# Patient Record
Sex: Female | Born: 1990 | Hispanic: No | Smoking: Never smoker
Health system: Southern US, Community
[De-identification: ages and names within clinical notes are randomized; demographics above are authoritative.]

## PROBLEM LIST (undated history)

## (undated) DIAGNOSIS — F32A Depression, unspecified: Secondary | ICD-10-CM

## (undated) DIAGNOSIS — B019 Varicella without complication: Secondary | ICD-10-CM

## (undated) DIAGNOSIS — I10 Essential (primary) hypertension: Secondary | ICD-10-CM

## (undated) DIAGNOSIS — F329 Major depressive disorder, single episode, unspecified: Secondary | ICD-10-CM

## (undated) DIAGNOSIS — Z9889 Other specified postprocedural states: Secondary | ICD-10-CM

## (undated) HISTORY — PX: BIOPSY THYROID: PRO38

## (undated) HISTORY — DX: Essential (primary) hypertension: I10

## (undated) HISTORY — DX: Other specified postprocedural states: Z98.890

## (undated) HISTORY — DX: Depression, unspecified: F32.A

## (undated) HISTORY — PX: OTHER SURGICAL HISTORY: SHX169

## (undated) HISTORY — DX: Major depressive disorder, single episode, unspecified: F32.9

## (undated) HISTORY — DX: Varicella without complication: B01.9

---

## 2015-08-27 ENCOUNTER — Encounter: Payer: Self-pay | Admitting: Adult Health

## 2015-08-27 ENCOUNTER — Telehealth: Payer: Self-pay | Admitting: Adult Health

## 2015-08-27 ENCOUNTER — Ambulatory Visit (INDEPENDENT_AMBULATORY_CARE_PROVIDER_SITE_OTHER): Payer: 59 | Admitting: Adult Health

## 2015-08-27 VITALS — BP 104/80 | Temp 98.3°F | Ht 64.5 in | Wt 231.9 lb

## 2015-08-27 DIAGNOSIS — F329 Major depressive disorder, single episode, unspecified: Secondary | ICD-10-CM

## 2015-08-27 DIAGNOSIS — F32A Depression, unspecified: Secondary | ICD-10-CM | POA: Insufficient documentation

## 2015-08-27 DIAGNOSIS — Z7189 Other specified counseling: Secondary | ICD-10-CM | POA: Diagnosis not present

## 2015-08-27 DIAGNOSIS — Z7689 Persons encountering health services in other specified circumstances: Secondary | ICD-10-CM

## 2015-08-27 MED ORDER — BUPROPION HCL ER (XL) 150 MG PO TB24: 150.0000 mg | ORAL_TABLET | Freq: Every day | ORAL | Status: DC

## 2015-08-27 NOTE — Telephone Encounter (Signed)
Pt is at pharm and they do not have her buPROPion (WELLBUTRIN XL) 150 MG 24 hr tablet She is gonna wait a few minutes, thanks! [facility]

## 2015-08-27 NOTE — Progress Notes (Signed)
Pre visit review using our clinic review tool, if applicable. No additional management support is needed unless otherwise documented below in the visit note. 

## 2015-08-27 NOTE — Telephone Encounter (Signed)
Called back to the pharmacy and was told they found the medication and was filling it for the patient.

## 2015-08-27 NOTE — Progress Notes (Signed)
HPI:  Jasmine Coleman is here to establish care. She is a pleasant AA female who  has a past medical history of Chicken pox; Depression; and Hypertension.  Last PCP and physical: Unknown " a long time"   Has the following chronic problems that require follow up and concerns today:  Depression  - PHQ 9 score 16. She has tried Buspar, Zoloft and Celexa in the past. Feels as though Buspar worked ok, the others did not. She has called psychiatry in the past but the wait was three months. She took an Orthoptist which said that " she was at risk for bipolar"  ADHD - Was diagnosed at a young age, was on meds from 2nd grade to middle school. She feels as though her ADHD symptoms are coming back. She has racing thoughts and is unable to focus on one thing.    ROS negative for unless reported above: fevers, chills,feeling poorly, unintentional weight loss, hearing or vision loss, chest pain, palpitations, leg claudication, struggling to breath,Not feeling congested in the chest, no orthopenia, no cough,no wheezing, normal appetite, no soft tissue swelling, no hemoptysis, melena, hematochezia, hematuria, falls, loc, si, or thoughts of self harm.  Immunizations:UTD Diet: Does not follow specific diet. 2-3 times a week eats fast food or eats out Exercise: Walks at work, nothing outside of work Pap Smear: June 2016, no abnormals  Past Medical History  Diagnosis Date  . Chicken pox   . Depression   . Hypertension     high blood pressure readings   . Thyroid disease     History reviewed. No pertinent past surgical history.  Family History  Problem Relation Age of Onset  . Lung cancer    . Prostate cancer    . Hypertension    . Mental illness      Social History   Social History  . Marital Status: Married    Spouse Name: N/A  . Number of Children: N/A  . Years of Education: N/A   Social History Main Topics  . Smoking status: Never Smoker   . Smokeless tobacco: None  . Alcohol  Use: 0.0 oz/week    0 Standard drinks or equivalent per week     Comment: occ.   . Drug Use: None  . Sexual Activity: Not Asked   Other Topics Concern  . None   Social History Narrative  . None    No current outpatient prescriptions on file.  EXAM:  Filed Vitals:   08/27/15 1358  BP: 104/80  Temp: 98.3 F (36.8 C)    Body mass index is 39.21 kg/(m^2).  GENERAL: vitals reviewed and listed above, alert, oriented, appears well hydrated and in no acute distress. Slightly obese  HEENT: atraumatic, conjunttiva clear, no obvious abnormalities on inspection of external nose and ears  NECK: Neck is soft and supple without masses, no adenopathy or thyromegaly, trachea midline, no JVD. Normal range of motion.   LUNGS: clear to auscultation bilaterally, no wheezes, rales or rhonchi, good air movement  CV: Regular rate and rhythm, normal S1/S2, no audible murmurs, gallops, or rubs. No carotid bruit and no peripheral edema.   MS: moves all extremities without noticeable abnormality. No edema noted  Abd: soft/nontender/nondistended/normal bowel sounds   Skin: warm and dry, no rash   Extremities: No clubbing, cyanosis, or edema. Capillary refill is WNL. Pulses intact bilaterally in upper and lower extremities.   Neuro: CN II-XII intact, sensation and reflexes normal throughout, 5/5 muscle strength in  bilateral upper and lower extremities. Normal finger to nose. Normal rapid alternating movements.   PSYCH: pleasant and cooperative, no obvious depression or anxiety  ASSESSMENT AND PLAN:  1. Encounter to establish care - Follow up for CPE - Follow up sooner - Stressed the importance of diet and exercise 2. Depression - buPROPion (WELLBUTRIN XL) 150 MG 24 hr tablet; Take 1 tablet (150 mg total) by mouth daily.  Dispense: 30 tablet; Refill: 3 - List of psychiatrists and ADHD clinics given - Go to the ER with any thoughts of SI or HI - PHQ 9 score of 16   -We reviewed the  PMH, PSH, FH, SH, Meds and Allergies. -We provided refills for any medications we will prescribe as needed. -We addressed current concerns per orders and patient instructions. -We have asked for records for pertinent exams, studies, vaccines and notes from previous providers. -We have advised patient to follow up per instructions below.   -Patient advised to return or notify a provider immediately if symptoms worsen or persist or new concerns arise.    Shirline Frees, AGNP

## 2015-08-27 NOTE — Patient Instructions (Signed)
It was great meeting you!  Follow up with one of the clinics for ADHD and Depression.   Start the Wellbutrin, you should notice effect in 4-6 weeks.   Follow up with me at my next available appointment for a complete physical

## 2015-10-07 ENCOUNTER — Other Ambulatory Visit (INDEPENDENT_AMBULATORY_CARE_PROVIDER_SITE_OTHER): Payer: 59

## 2015-10-07 DIAGNOSIS — Z0000 Encounter for general adult medical examination without abnormal findings: Secondary | ICD-10-CM

## 2015-10-07 LAB — CBC WITH DIFFERENTIAL/PLATELET
BASOS PCT: 0.6 % (ref 0.0–3.0)
Basophils Absolute: 0 10*3/uL (ref 0.0–0.1)
EOS PCT: 3.8 % (ref 0.0–5.0)
Eosinophils Absolute: 0.3 10*3/uL (ref 0.0–0.7)
HEMATOCRIT: 43.1 % (ref 36.0–46.0)
Hemoglobin: 14 g/dL (ref 12.0–15.0)
LYMPHS ABS: 2.9 10*3/uL (ref 0.7–4.0)
LYMPHS PCT: 39.7 % (ref 12.0–46.0)
MCHC: 32.5 g/dL (ref 30.0–36.0)
MCV: 87.2 fl (ref 78.0–100.0)
MONOS PCT: 6.8 % (ref 3.0–12.0)
Monocytes Absolute: 0.5 10*3/uL (ref 0.1–1.0)
NEUTROS ABS: 3.6 10*3/uL (ref 1.4–7.7)
Neutrophils Relative %: 49.1 % (ref 43.0–77.0)
Platelets: 266 10*3/uL (ref 150.0–400.0)
RBC: 4.94 Mil/uL (ref 3.87–5.11)
RDW: 13.2 % (ref 11.5–15.5)
WBC: 7.4 10*3/uL (ref 4.0–10.5)

## 2015-10-07 LAB — HEPATIC FUNCTION PANEL
ALT: 12 U/L (ref 0–35)
AST: 17 U/L (ref 0–37)
Albumin: 3.7 g/dL (ref 3.5–5.2)
Alkaline Phosphatase: 71 U/L (ref 39–117)
BILIRUBIN DIRECT: 0.1 mg/dL (ref 0.0–0.3)
TOTAL PROTEIN: 7.1 g/dL (ref 6.0–8.3)
Total Bilirubin: 0.4 mg/dL (ref 0.2–1.2)

## 2015-10-07 LAB — POCT URINALYSIS DIPSTICK
Bilirubin, UA: NEGATIVE
GLUCOSE UA: NEGATIVE
NITRITE UA: NEGATIVE
PH UA: 6
PROTEIN UA: NEGATIVE
RBC UA: NEGATIVE
UROBILINOGEN UA: 1

## 2015-10-07 LAB — BASIC METABOLIC PANEL
BUN: 13 mg/dL (ref 6–23)
CALCIUM: 9.1 mg/dL (ref 8.4–10.5)
CHLORIDE: 106 meq/L (ref 96–112)
CO2: 30 meq/L (ref 19–32)
Creatinine, Ser: 0.84 mg/dL (ref 0.40–1.20)
GFR: 88.87 mL/min (ref >60.00)
GLUCOSE: 92 mg/dL (ref 70–99)
POTASSIUM: 3.9 meq/L (ref 3.5–5.1)
SODIUM: 142 meq/L (ref 135–145)

## 2015-10-07 LAB — LIPID PANEL
CHOL/HDL RATIO: 3
Cholesterol: 155 mg/dL (ref 0–200)
HDL: 49.9 mg/dL (ref >39.00)
LDL CALC: 94 mg/dL (ref 0–99)
NONHDL: 104.85
TRIGLYCERIDES: 54 mg/dL (ref 0.0–149.0)
VLDL: 10.8 mg/dL (ref 0.0–40.0)

## 2015-10-07 LAB — TSH: TSH: 1.29 u[IU]/mL (ref 0.35–4.50)

## 2015-10-13 ENCOUNTER — Encounter: Payer: 59 | Admitting: Adult Health

## 2015-10-13 DIAGNOSIS — Z0289 Encounter for other administrative examinations: Secondary | ICD-10-CM

## 2015-10-25 ENCOUNTER — Encounter: Payer: Self-pay | Admitting: Adult Health

## 2016-01-28 DIAGNOSIS — Z30432 Encounter for removal of intrauterine contraceptive device: Secondary | ICD-10-CM | POA: Diagnosis not present

## 2016-01-28 DIAGNOSIS — Z309 Encounter for contraceptive management, unspecified: Secondary | ICD-10-CM | POA: Diagnosis not present

## 2016-02-14 MED FILL — NUVARING VAGINAL RING: 0.12-0.015 | 84 days supply | Qty: 3 | Fill #0

## 2016-04-03 DIAGNOSIS — F3181 Bipolar II disorder: Secondary | ICD-10-CM | POA: Diagnosis not present

## 2016-05-08 MED FILL — NUVARING VAGINAL RING: 0.12-0.015 | 55 days supply | Qty: 2 | Fill #1

## 2016-05-25 DIAGNOSIS — H5213 Myopia, bilateral: Secondary | ICD-10-CM | POA: Diagnosis not present

## 2016-06-15 ENCOUNTER — Ambulatory Visit (INDEPENDENT_AMBULATORY_CARE_PROVIDER_SITE_OTHER): Payer: 59 | Admitting: Adult Health

## 2016-06-15 ENCOUNTER — Encounter: Payer: Self-pay | Admitting: Adult Health

## 2016-06-15 VITALS — BP 124/64 | Temp 98.4°F | Ht 64.5 in | Wt 226.3 lb

## 2016-06-15 DIAGNOSIS — F32A Depression, unspecified: Secondary | ICD-10-CM

## 2016-06-15 DIAGNOSIS — F329 Major depressive disorder, single episode, unspecified: Secondary | ICD-10-CM

## 2016-06-15 DIAGNOSIS — E669 Obesity, unspecified: Secondary | ICD-10-CM | POA: Diagnosis not present

## 2016-06-15 MED ORDER — PHENTERMINE HCL 15 MG PO CAPS: 15.0000 mg | ORAL_CAPSULE | ORAL | Status: DC

## 2016-06-15 MED FILL — PHENTERMINE 15 MG CAPSULE: 15 | 30 days supply | Qty: 30 | Fill #0

## 2016-06-15 NOTE — Patient Instructions (Addendum)
It was great seeing you again!  As discussed, take the phentermine in the morning. Eat healthy and exercise, make sure your husband is working out with you.   Follow up with me in one month   If you need anything in the meantime, please let me know  Exercising to Lose Weight Exercising can help you to lose weight. In order to lose weight through exercise, you need to do vigorous-intensity exercise. You can tell that you are exercising with vigorous intensity if you are breathing very hard and fast and cannot hold a conversation while exercising. Moderate-intensity exercise helps to maintain your current weight. You can tell that you are exercising at a moderate level if you have a higher heart rate and faster breathing, but you are still able to hold a conversation. HOW OFTEN SHOULD I EXERCISE? Choose an activity that you enjoy and set realistic goals. Your health care provider can help you to make an activity plan that works for you. Exercise regularly as directed by your health care provider. This may include:  Doing resistance training twice each week, such as:  Push-ups.  Sit-ups.  Lifting weights.  Using resistance bands.  Doing a given intensity of exercise for a given amount of time. Choose from these options:  150 minutes of moderate-intensity exercise every week.  75 minutes of vigorous-intensity exercise every week.  A mix of moderate-intensity and vigorous-intensity exercise every week. Children, pregnant women, people who are out of shape, people who are overweight, and older adults may need to consult a health care provider for individual recommendations. If you have any sort of medical condition, be sure to consult your health care provider before starting a new exercise program. WHAT ARE SOME ACTIVITIES THAT CAN HELP ME TO LOSE WEIGHT?   Walking at a rate of at least 4.5 miles an hour.  Jogging or running at a rate of 5 miles per hour.  Biking at a rate of at  least 10 miles per hour.  Lap swimming.  Roller-skating or in-line skating.  Cross-country skiing.  Vigorous competitive sports, such as football, basketball, and soccer.  Jumping rope.  Aerobic dancing. HOW CAN I BE MORE ACTIVE IN MY DAY-TO-DAY ACTIVITIES?  Use the stairs instead of the elevator.  Take a walk during your lunch break.  If you drive, park your car farther away from work or school.  If you take public transportation, get off one stop early and walk the rest of the way.  Make all of your phone calls while standing up and walking around.  Get up, stretch, and walk around every 30 minutes throughout the day. WHAT GUIDELINES SHOULD I FOLLOW WHILE EXERCISING?  Do not exercise so much that you hurt yourself, feel dizzy, or get very short of breath.  Consult your health care provider prior to starting a new exercise program.  Wear comfortable clothes and shoes with good support.  Drink plenty of water while you exercise to prevent dehydration or heat stroke. Body water is lost during exercise and must be replaced.  Work out until you breathe faster and your heart beats faster.   This information is not intended to replace advice given to you by your health care provider. Make sure you discuss any questions you have with your health care provider.   Document Released: 01/06/2011 Document Revised: 12/25/2014 Document Reviewed: 05/07/2014 Elsevier Interactive Patient Education [employer]2016 Elsevier Inc.

## 2016-06-15 NOTE — Progress Notes (Signed)
Subjective:    Patient ID: Jasmine Coleman, female    DOB: Jul 14, 1991, 25 y.o.   MRN: [id number]  HPI  25 year old female who presents to the office today for follow up regarding depression and she would like to talk about weight loss.   I last saw 08/27/2015 and started her Wellbutrin. After starting this medications she noticed that she was having thoughts of SI. She stopped taking Wellbutrin and has since started seeing Psychiatry. Psyciatry has not started her on any new medication. She feels as though she is doing well managing her depression without medication.   She would also like to start using phentermine to help her lose weight. She is eating healthy and is exercising with her husband.   Wt Readings from Last 3 Encounters:  06/15/16 226 lb 4.8 oz (102.649 kg)  08/27/15 231 lb 14.4 oz (105.189 kg)     Review of Systems  Constitutional: Negative.   Respiratory: Negative.   Cardiovascular: Negative.   Musculoskeletal: Negative.   Neurological: Negative.   All other systems reviewed and are negative.  Past Medical History  Diagnosis Date  . Chicken pox   . Depression   . Hypertension     high blood pressure readings after pregnancy  . S/P thyroid biopsy     Social History   Social History  . Marital Status: Married    Spouse Name: N/A  . Number of Children: N/A  . Years of Education: N/A   Occupational History  . Not on file.   Social History Main Topics  . Smoking status: Never Smoker   . Smokeless tobacco: Not on file  . Alcohol Use: 0.0 oz/week    0 Standard drinks or equivalent per week     Comment: occ.   . Drug Use: No  . Sexual Activity: Yes    Birth Control/ Protection: IUD   Other Topics Concern  . 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 Biopsy thyroid    . Wisdome tooth extraction       Family History  Problem Relation Age of Onset  . Lung cancer Maternal Grandmother   . Prostate cancer Maternal Grandfather     unsure  . Hypertension Mother   . Mental illness Mother     unsure  . Hypertension Maternal Grandmother   . Mental illness Maternal Grandmother     unsure    No Known Allergies  No current outpatient prescriptions on file prior to visit.   No current facility-administered medications on file prior to visit.    BP 124/64 mmHg  Temp(Src) 98.4 F (36.9 C) (Oral)  Ht 5' 4.5" (1.638 m)  Wt 226 lb 4.8 oz (102.649 kg)  BMI 38.26 kg/m2       Objective:   Physical Exam  Constitutional: She is oriented to person, place, and time. She appears well-developed and well-nourished. No distress.  Cardiovascular: Normal rate, regular rhythm, normal heart sounds and intact distal pulses.  Exam reveals no gallop and no friction rub.   No murmur heard. Pulmonary/Chest: Effort normal and breath sounds normal. No respiratory distress. She has no wheezes. She has no rales. She exhibits no tenderness.  Abdominal:  obese  Neurological: She is alert and oriented to person, place,  and time.  Skin: Skin is warm and dry. No rash noted. She is not diaphoretic. No erythema. No pallor.  Psychiatric: She has a normal mood and affect. Her behavior is normal. Judgment and thought content normal.  Vitals reviewed.     Assessment & Plan:  1. Depression - Continue to follow up with psychiatry and counseling - let me know if she has any signs of SI or thoughts of self harm   2. Obesity - Risks and benefits reviewed/. I think she would be a good candidate for phentermine. We discussed side effects. Will start with phentermine 15 mg  - She knows she needs to eat healthy and exercise.  - phentermine 15 MG capsule; Take 1 capsule (15 mg total) by mouth every morning.  Dispense: 30 capsule; Refill: 0 - Follow up in one month or sooner if needed  Jasmine Freesory Sabrinia Prien, NP

## 2016-06-16 DIAGNOSIS — Z6838 Body mass index (BMI) 38.0-38.9, adult: Secondary | ICD-10-CM | POA: Diagnosis not present

## 2016-06-16 DIAGNOSIS — Z124 Encounter for screening for malignant neoplasm of cervix: Secondary | ICD-10-CM | POA: Diagnosis not present

## 2016-06-16 DIAGNOSIS — Z01419 Encounter for gynecological examination (general) (routine) without abnormal findings: Secondary | ICD-10-CM | POA: Diagnosis not present

## 2016-06-16 DIAGNOSIS — N899 Noninflammatory disorder of vagina, unspecified: Secondary | ICD-10-CM | POA: Diagnosis not present

## 2016-06-16 LAB — HM PAP SMEAR: HM Pap smear: NORMAL

## 2016-06-27 DIAGNOSIS — F329 Major depressive disorder, single episode, unspecified: Secondary | ICD-10-CM | POA: Diagnosis not present

## 2016-07-06 DIAGNOSIS — F329 Major depressive disorder, single episode, unspecified: Secondary | ICD-10-CM | POA: Diagnosis not present

## 2016-07-06 MED FILL — NUVARING VAGINAL RING: 0.12-0.015 | 84 days supply | Qty: 3 | Fill #0

## 2016-07-13 DIAGNOSIS — F329 Major depressive disorder, single episode, unspecified: Secondary | ICD-10-CM | POA: Diagnosis not present

## 2016-07-17 DIAGNOSIS — F329 Major depressive disorder, single episode, unspecified: Secondary | ICD-10-CM | POA: Diagnosis not present

## 2016-07-18 ENCOUNTER — Encounter: Payer: Self-pay | Admitting: Adult Health

## 2016-07-18 ENCOUNTER — Ambulatory Visit (INDEPENDENT_AMBULATORY_CARE_PROVIDER_SITE_OTHER): Payer: 59 | Admitting: Adult Health

## 2016-07-18 DIAGNOSIS — E669 Obesity, unspecified: Secondary | ICD-10-CM

## 2016-07-18 MED ORDER — PHENTERMINE HCL 15 MG PO CAPS: 30.0000 mg | ORAL_CAPSULE | ORAL | 0 refills | Status: DC

## 2016-07-18 MED ORDER — PHENTERMINE HCL 30 MG PO CAPS: 30.0000 mg | ORAL_CAPSULE | ORAL | 0 refills | Status: DC

## 2016-07-18 NOTE — Progress Notes (Signed)
Subjective:    Patient ID: Jasmine Coleman, female    DOB: [birth year], 25 y.o.   MRN: [id number]  HPI  25 year old female who presents to the office today for follow up regarding weight loss. She reports that she is eating healthy and exercising. Her overall mood has improved and she is feeling " really good." She had a day or two of palpitations when first starting Phentermine but that it quickly resolved.  She would like to try going up on Phentermine.   She has lost 4 pounds in the last month   Has no other complaints.   Review of Systems  Constitutional: Negative.   Respiratory: Negative.   Cardiovascular: Positive for palpitations (resolved).  Gastrointestinal: Negative.   Genitourinary: Negative.   Neurological: Negative.   Hematological: Negative.   All other systems reviewed and are negative.  Past Medical History:  Diagnosis Date  . Chicken pox   . Depression   . Hypertension    high blood pressure readings after pregnancy  . S/P thyroid biopsy     Social History   Social History  . Marital status: Married    Spouse name: N/A  . Number of children: N/A  . Years of education: N/A   Occupational History  . Not on file.   Social History Main Topics  . Smoking status: Never Smoker  . Smokeless tobacco: Not on file  . Alcohol use 0.0 oz/week     Comment: occ.   . Drug use: No  . Sexual activity: Yes    Birth control/ protection: IUD   Other Topics Concern  . 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 BIOPSY THYROID    . Wisdome Tooth Extraction      Family History  Problem Relation Age of Onset  . Lung cancer Maternal Grandmother   . Prostate cancer Maternal Grandfather     unsure  . Hypertension Mother   . Mental illness Mother     unsure  . Hypertension Maternal Grandmother   . Mental illness  Maternal Grandmother     unsure    No Known Allergies  No current outpatient prescriptions on file prior to visit.   No current facility-administered medications on file prior to visit.     BP 106/62   Temp 98.3 F (36.8 C) (Oral)   Ht 5' 4.5" (1.638 m)   Wt 222 lb 9.6 oz (101 kg)   BMI 37.62 kg/m       Objective:   Physical Exam  Constitutional: She is oriented to person, place, and time. She appears well-developed and well-nourished. No distress.  Eyes: Conjunctivae and EOM are normal. Pupils are equal, round, and reactive to light. Right eye exhibits no discharge. Left eye exhibits no discharge. No scleral icterus.  Cardiovascular: Normal rate, regular rhythm, normal heart sounds and intact distal pulses.  Exam reveals no gallop and no friction rub.   No murmur heard. Pulmonary/Chest: Effort normal and breath sounds normal. No respiratory distress. She has no wheezes. She has no rales. She exhibits no tenderness.  Neurological: She is alert and oriented to person, place, and time.  Skin: Skin is warm and dry. No rash noted. She is not diaphoretic. No erythema. No pallor.  Psychiatric: She  has a normal mood and affect. Her behavior is normal. Judgment and thought content normal.  Nursing note and vitals reviewed.     Assessment & Plan:  1. Obesity - Continue to eat healthy and exercise - Follow up in one month  - phentermine 30 MG capsule; Take 1 capsule (30 mg total) by mouth every morning.  Dispense: 30 capsule; Refill: 0

## 2016-07-18 NOTE — Patient Instructions (Signed)
It was great seeing you again   I have increased the phentermine from 15 mg to 30 mg.   Follow up with me in one month. If you need anything before that, please let me know

## 2016-07-20 MED FILL — PHENTERMINE 30 MG CAPSULE: 30 | 30 days supply | Qty: 30 | Fill #0

## 2016-07-27 DIAGNOSIS — F329 Major depressive disorder, single episode, unspecified: Secondary | ICD-10-CM | POA: Diagnosis not present

## 2016-07-30 DIAGNOSIS — K120 Recurrent oral aphthae: Secondary | ICD-10-CM | POA: Diagnosis not present

## 2016-07-30 DIAGNOSIS — J029 Acute pharyngitis, unspecified: Secondary | ICD-10-CM | POA: Diagnosis not present

## 2016-08-18 ENCOUNTER — Encounter: Payer: Self-pay | Admitting: Adult Health

## 2016-08-18 ENCOUNTER — Ambulatory Visit (INDEPENDENT_AMBULATORY_CARE_PROVIDER_SITE_OTHER): Payer: 59 | Admitting: Adult Health

## 2016-08-18 VITALS — BP 112/64 | Temp 98.6°F | Ht 64.5 in | Wt 219.0 lb

## 2016-08-18 DIAGNOSIS — E669 Obesity, unspecified: Secondary | ICD-10-CM

## 2016-08-18 MED ORDER — LORCASERIN HCL 10 MG PO TABS: 10.0000 mg | ORAL_TABLET | Freq: Two times a day (BID) | ORAL | 0 refills | Status: DC

## 2016-08-18 NOTE — Progress Notes (Signed)
   Subjective:    Patient ID: Jasmine Coleman, female    DOB: 1991-04-25, 25 y.o.   MRN: [id number]  HPI    25 year old female who went to the office today for follow up regarding weight loss. She had been taking phentermine on a regular basis. She had gone to urgent care for a separate issue and when she was there it was found that her blood pressure was 160/101. She was asked to stop taking phentermine. She does endorse episodes of headaches when her blood pressure was elevated.   Her blood pressure has returned to normal since she stopped phentermine.   She would like to try Belviq  Review of Systems  Constitutional: Negative.   HENT: Negative.   Respiratory: Negative.   Cardiovascular: Negative.   Gastrointestinal: Negative.   Neurological: Negative.   All other systems reviewed and are negative.      Objective:   Physical Exam  Constitutional: She appears well-developed and well-nourished. No distress.  HENT:  Head: Normocephalic and atraumatic.  Right Ear: External ear normal.  Left Ear: External ear normal.  Nose: Nose normal.  Mouth/Throat: Oropharynx is clear and moist. No oropharyngeal exudate.  Eyes: Conjunctivae and EOM are normal. Pupils are equal, round, and reactive to light. Right eye exhibits no discharge. Left eye exhibits no discharge. No scleral icterus.  Cardiovascular: Normal rate, regular rhythm, normal heart sounds and intact distal pulses.  Exam reveals no gallop and no friction rub.   No murmur heard. Pulmonary/Chest: Breath sounds normal. No respiratory distress. She has no wheezes. She has no rales. She exhibits no tenderness.  Neurological: She is alert.  Skin: Skin is warm and dry. She is not diaphoretic. No erythema. No pallor.  Psychiatric: She has a normal mood and affect. Her behavior is normal. Judgment normal.  Nursing note and vitals reviewed.     Assessment & Plan:  1. Obesity - D/c Phentermine - Lorcaserin HCl (BELVIQ) 10 MG  TABS; Take 10 mg by mouth 2 (two) times daily.  Dispense: 60 tablet; Refill: 0 - Continue to exercise and eat healthy - Follow up in one month   Shirline Freesory Diar Berkel, NP

## 2016-08-23 ENCOUNTER — Ambulatory Visit: Payer: 59 | Admitting: Adult Health

## 2016-08-24 ENCOUNTER — Encounter: Payer: Self-pay | Admitting: Adult Health

## 2016-08-24 ENCOUNTER — Ambulatory Visit (INDEPENDENT_AMBULATORY_CARE_PROVIDER_SITE_OTHER): Payer: 59 | Admitting: Adult Health

## 2016-08-24 ENCOUNTER — Telehealth: Payer: Self-pay | Admitting: Adult Health

## 2016-08-24 ENCOUNTER — Telehealth: Payer: Self-pay

## 2016-08-24 VITALS — BP 128/74 | Temp 98.6°F | Ht 64.5 in | Wt 218.8 lb

## 2016-08-24 DIAGNOSIS — R600 Localized edema: Secondary | ICD-10-CM

## 2016-08-24 LAB — BASIC METABOLIC PANEL
BUN: 8 mg/dL (ref 6–23)
CALCIUM: 8.7 mg/dL (ref 8.4–10.5)
CO2: 28 meq/L (ref 19–32)
CREATININE: 0.8 mg/dL (ref 0.40–1.20)
Chloride: 107 mEq/L (ref 96–112)
GFR: 93.32 mL/min (ref >60.00)
GLUCOSE: 90 mg/dL (ref 70–99)
Potassium: 4 mEq/L (ref 3.5–5.1)
SODIUM: 142 meq/L (ref 135–145)

## 2016-08-24 LAB — HEMOGLOBIN A1C: Hgb A1c MFr Bld: 5 % (ref 4.6–6.5)

## 2016-08-24 NOTE — Progress Notes (Signed)
Subjective:    Patient ID: Jasmine Coleman, female    DOB: [birth year], 25 y.o.   MRN: [id number]  HPI   25 year old female who presents to the office today for a chronic issue of left lower extremity edema. She reports that over the last 3 years she has had episodes of left lower extremity swelling. She has noticed that as of lately the edema has been coming worse and she has had episodes of numbness and tingling in her left foot. She wears compression socks at work which she reports helps but when she is not at work and not wearing compression socks that the swelling becomes worse. She does elevate her legs at night.   She does not eat foods high in sodium  She denies any pain in her calf. No bruising or tenderness noted.   Wt Readings from Last 3 Encounters:  08/24/16 218 lb 12.8 oz (99.2 kg)  08/18/16 219 lb (99.3 kg)  07/18/16 222 lb 9.6 oz (101 kg)     Review of Systems  Constitutional: Negative.   Respiratory: Negative.   Cardiovascular: Positive for leg swelling. Negative for chest pain and palpitations.  Gastrointestinal: Negative.   Musculoskeletal: Negative.   Neurological: Positive for numbness.  All other systems reviewed and are negative.  Past Medical History:  Diagnosis Date  . Chicken pox   . Depression   . Hypertension    high blood pressure readings after pregnancy  . S/P thyroid biopsy     Social History   Social History  . Marital status: Married    Spouse name: N/A  . Number of children: N/A  . Years of education: N/A   Occupational History  . Not on file.   Social History Main Topics  . Smoking status: Never Smoker  . Smokeless tobacco: Not on file  . Alcohol use 0.0 oz/week     Comment: occ.   . Drug use: No  . Sexual activity: Yes    Birth control/ protection: IUD   Other Topics Concern  . 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 BIOPSY THYROID    . Wisdome Tooth Extraction      Family History  Problem Relation Age of Onset  . Lung cancer Maternal Grandmother   . Prostate cancer Maternal Grandfather     unsure  . Hypertension Mother   . Mental illness Mother     unsure  . Hypertension Maternal Grandmother   . Mental illness Maternal Grandmother     unsure    No Known Allergies  Current Outpatient Prescriptions on File Prior to Visit  Medication Sig Dispense Refill  . Lorcaserin HCl (BELVIQ) 10 MG TABS Take 10 mg by mouth 2 (two) times daily. (Patient not taking: Reported on 08/24/2016) 60 tablet 0   No current facility-administered medications on file prior to visit.     BP 128/74   Temp 98.6 F (37 C) (Oral)   Ht 5' 4.5" (1.638 m)   Wt 218 lb 12.8 oz (99.2 kg)   BMI 36.98 kg/m       Objective:   Physical Exam  Constitutional: She is oriented to person, place, and time. She appears well-developed and well-nourished.  Cardiovascular: Normal rate, regular rhythm, normal heart  sounds and intact distal pulses.  Exam reveals no gallop.   No murmur heard. Pulmonary/Chest: Effort normal. No respiratory distress. She has no wheezes. She has no rales. She exhibits no tenderness.  Musculoskeletal: Normal range of motion. She exhibits edema (trace non pitting edema in left leg. No signs of DVT ). She exhibits no tenderness or deformity.  Neurological: She is alert and oriented to person, place, and time. She has normal reflexes. She displays normal reflexes. She exhibits normal muscle tone. Coordination normal.  Skin: Skin is warm and dry. No rash noted. She is not diaphoretic. No erythema. No pallor.  Psychiatric: She has a normal mood and affect. Her behavior is normal. Judgment and thought content normal.  Nursing note and vitals reviewed.     Assessment & Plan:  1. Edema of right lower extremity - Likely related to obesity. Will check labs - Basic  metabolic panel - Hemoglobin A1c - I would be hopeful that as she losing weight the lower extremity edema will resolve.   Shirline Frees, NP

## 2016-08-24 NOTE — Telephone Encounter (Signed)
[facility] PA request for Belviq [facility]. PA submitted & is pending. ZOX:[id number]

## 2016-08-24 NOTE — Telephone Encounter (Signed)
Updated patient on her labs  

## 2016-08-24 NOTE — Patient Instructions (Signed)
It was great seeing you again.   I will follow up with you regarding your blood work and then we can go from there.

## 2016-08-25 NOTE — Telephone Encounter (Signed)
PA approved & pharmacy aware. 

## 2016-08-28 MED FILL — BELVIQ 10 MG TABLET: 10 | 30 days supply | Qty: 60 | Fill #0

## 2016-09-18 MED FILL — NUVARING VAGINAL RING: 0.12-0.015 | 84 days supply | Qty: 3 | Fill #0

## 2016-09-19 ENCOUNTER — Ambulatory Visit: Payer: 59 | Admitting: Adult Health

## 2016-10-15 ENCOUNTER — Ambulatory Visit (HOSPITAL_COMMUNITY)
Admission: EM | Admit: 2016-10-15 | Discharge: 2016-10-15 | Disposition: A | Discharge status: Home / Self Care | Payer: 59 | Attending: Emergency Medicine | Admitting: Emergency Medicine

## 2016-10-15 ENCOUNTER — Encounter (HOSPITAL_COMMUNITY): Payer: Self-pay | Admitting: *Deleted

## 2016-10-15 DIAGNOSIS — J029 Acute pharyngitis, unspecified: Secondary | ICD-10-CM | POA: Diagnosis not present

## 2016-10-15 LAB — POCT RAPID STREP A: STREPTOCOCCUS, GROUP A SCREEN (DIRECT): NEGATIVE

## 2016-10-15 MED ORDER — MOMETASONE FUROATE 50 MCG/ACT NA SUSP: 2.0000 | Freq: Every day | NASAL | 0 refills | Status: DC

## 2016-10-15 MED ORDER — IBUPROFEN 800 MG PO TABS: 800.0000 mg | ORAL_TABLET | Freq: Three times a day (TID) | ORAL | 0 refills | Status: DC

## 2016-10-15 NOTE — ED Provider Notes (Signed)
HPI  SUBJECTIVE:  Patient reports sore throat starting 2 days ago. Sx worse with acidic drinks. No alleviating factors. She has tried steam, hot tea, nasal irrigation, humidifier, and sinus medications without improvement. no Fever     + Occasional dry Cough. Reports nasal congestion, rhinorrhea, postnasal drip, but states that this is not new. She reports bilateral ear fullness but no ear pain. No Myalgias No Headache No Rash     Daughter is being treated empirically for strep throat and pneumonia. No Abdominal Pain No reflux sxs No Allergy sxs  No Breathing difficulty, voice changes No Drooling No Trismus No abx in past month.  + antipyretic in past 6-8 hr Past medical history strep throat, allergies. No history of GERD, mono LMP: Beginning of October. Denies possibility of being pregnant. PMD: [facility].    Past Medical History:  Diagnosis Date  . Chicken pox   . Depression   . Hypertension    high blood pressure readings after pregnancy  . S/P thyroid biopsy     Past Surgical History:  Procedure Laterality Date  . BIOPSY THYROID    . Wisdome Tooth Extraction      Family History  Problem Relation Age of Onset  . Lung cancer Maternal Grandmother   . Hypertension Maternal Grandmother   . Mental illness Maternal Grandmother     unsure  . Prostate cancer Maternal Grandfather     unsure  . Hypertension Mother   . Mental illness Mother     unsure    Social History  Substance Use Topics  . Smoking status: Never Smoker  . Smokeless tobacco: Not on file  . Alcohol use 0.0 oz/week     Comment: occ.     No current facility-administered medications for this encounter.   Current Outpatient Prescriptions:  .  ibuprofen (ADVIL,MOTRIN) 800 MG tablet, Take 1 tablet (800 mg total) by mouth 3 (three) times daily., Disp: 30 tablet, Rfl: 0 .  Lorcaserin HCl (BELVIQ) 10 MG TABS, Take 10 mg by mouth 2 (two) times daily. (Patient not taking: Reported on  08/24/2016), Disp: 60 tablet, Rfl: 0 .  mometasone (NASONEX) 50 MCG/ACT nasal spray, Place 2 sprays into the nose daily., Disp: 17 g, Rfl: 0  No Known Allergies   ROS  As noted in HPI.   Physical Exam  BP 147/92   Pulse 96   Temp 98.3 F (36.8 C) (Oral)   Resp 16   LMP 09/22/2016   SpO2 100%   Constitutional: Well developed, well nourished, no acute distress Eyes:  EOMI, conjunctiva normal bilaterally HENT: Normocephalic, atraumatic,mucus membranes moist. TMs normal bilaterally. - nasal congestion  - erythematous oropharynx - enlarged tonsils  - exudates. Uvula midline. No nasal congestion. Positive cobblestoning. No obvious postnasal drip. Respiratory: Normal inspiratory effort Cardiovascular: Normal rate, no murmurs, rubs, gallops GI: nondistended, nontender. No appreciable splenomegaly skin: No rash, skin intact Lymph: -  cervical LN  Musculoskeletal: no deformities Neurologic: Alert & oriented x 3, no focal neuro deficits Psychiatric: Speech and behavior appropriate.   ED Course   Medications - No data to display  Orders Placed This Encounter  Procedures  . POCT rapid strep A [facility]    Standing Status:   Standing    Number of Occurrences:   1    Results for orders placed or performed during the hospital encounter of 10/15/16 (from the past 24 hour(s))  POCT rapid strep A [facility])     Status: None  Collection Time: 10/15/16  3:53 PM  Result Value Ref Range   Streptococcus, Group A Screen (Direct) NEGATIVE NEGATIVE   No results found.  ED Clinical Impression  Sore throat  ED Assessment/Plan  Rapid strep negative. Obtaining throat culture to guide antibiotic treatment. Discussed this with patient. We'll contact them if culture is positive, and will call in Appropriate antibiotics. Patient home with ibuprofen, Tylenol, Benadryl Maalox mixture, Nasonex to help with her postnasal drip and nasal congestion. Patient to followup with PMD when  necessary.   Discussed MDM, plan and followup with patient. Discussed sn/sx that should prompt return to the UC or ED. Patient  agrees with plan.   *This clinic note was created using Dragon dictation software. Therefore, there may be occasional mistakes despite careful proofreading.    Domenick GongAshley Tetsuo Coppola, MD 10/15/16 [phone]

## 2016-10-15 NOTE — Discharge Instructions (Signed)
your rapid strep was negative today, so we have sent off a throat culture.  We will contact you and call in the appropriate antibiotics if your culture comes back positive for an infection requiring antibiotic treatment.  Give us a working phone number.  Tylenol and ibuprofen together as needed for pain.  Make sure you drink plenty of extra fluids.  Some people find salt water gargles and  Traditional Medicinal's "Throat Coat" tea helpful. Take 5 mL of liquid Benadryl and 5 mL of Maalox. Mix it together, and then hold it in your mouth for as long as you can and then swallow. You may do this 4 times a day.   ° °Go to www.goodrx.com to look up your medications. This will give you a list of where you can find your prescriptions at the most affordable prices. ° °

## 2016-10-15 NOTE — ED Triage Notes (Signed)
Pt  Reports   sorethroat     Runny  Nose   With  Fever   Dry  Cough   Symptoms  X  2  Days     Daughter  Being  Treated  For  Throat  Infection

## 2016-10-18 LAB — CULTURE, GROUP A STREP (THRC)

## 2016-10-19 MED FILL — MOMETASONE FUROATE 50 MCG S: 50 | 30 days supply | Qty: 17 | Fill #0

## 2016-10-19 MED FILL — IBUPROFEN 800 MG TABLET: 800 | 10 days supply | Qty: 30 | Fill #0

## 2016-11-10 ENCOUNTER — Emergency Department (INDEPENDENT_AMBULATORY_CARE_PROVIDER_SITE_OTHER): Payer: 59

## 2016-11-10 ENCOUNTER — Encounter: Payer: Self-pay | Admitting: Emergency Medicine

## 2016-11-10 ENCOUNTER — Emergency Department: Payer: 59

## 2016-11-10 ENCOUNTER — Emergency Department
Admission: EM | Admit: 2016-11-10 | Discharge: 2016-11-10 | Disposition: A | Discharge status: Home / Self Care | Payer: 59 | Source: Home / Self Care | Attending: Family Medicine | Admitting: Family Medicine

## 2016-11-10 DIAGNOSIS — S93601A Unspecified sprain of right foot, initial encounter: Secondary | ICD-10-CM | POA: Diagnosis not present

## 2016-11-10 DIAGNOSIS — M79671 Pain in right foot: Secondary | ICD-10-CM | POA: Diagnosis not present

## 2016-11-10 DIAGNOSIS — S99921A Unspecified injury of right foot, initial encounter: Secondary | ICD-10-CM | POA: Diagnosis not present

## 2016-11-10 MED ORDER — HYDROCODONE-ACETAMINOPHEN 5-325 MG PO TABS: 1.0000 | ORAL_TABLET | Freq: Four times a day (QID) | ORAL | 0 refills | Status: DC | PRN

## 2016-11-10 MED FILL — HYDROCODON-APAP 5-325: 5-325 | 3 days supply | Qty: 10 | Fill #0

## 2016-11-10 NOTE — ED Provider Notes (Signed)
[facility]    CSN: [id number] Arrival date & time: 11/10/16  1428     History   Chief Complaint Chief Complaint  Patient presents with  . Foot Pain    HPI Jasmine Merryl HackerMarie Coleman is a 25 y.o. female.   Patient reports that she missed a step while leaving work this morning at about 7:15am.  She hyperflexed her right foot, resulting in dorsal pain of her foot.   The history is provided by the patient.  Foot Pain  This is a new problem. The current episode started 6 to 12 hours ago. The problem occurs constantly. The problem has not changed since onset.Associated symptoms comments: None . The symptoms are aggravated by walking. Nothing relieves the symptoms. Treatments tried: Ice pack and Ibuprofen. The treatment provided mild relief.    Past Medical History:  Diagnosis Date  . Chicken pox   . Depression   . Hypertension    high blood pressure readings after pregnancy  . S/P thyroid biopsy     Patient Active Problem List   Diagnosis Date Noted  . Obesity 07/18/2016  . Depression 08/27/2015    Past Surgical History:  Procedure Laterality Date  . BIOPSY THYROID    . Wisdome Tooth Extraction      OB History    No data available       Home Medications    Prior to Admission medications   Medication Sig Start Date End Date Taking? Authorizing Provider  HYDROcodone-acetaminophen (NORCO/VICODIN) 5-325 MG tablet Take 1 tablet by mouth every 6 (six) hours as needed for moderate pain. 11/10/16   Lattie HawStephen A Taygen Acklin, MD  ibuprofen (ADVIL,MOTRIN) 800 MG tablet Take 1 tablet (800 mg total) by mouth 3 (three) times daily. 10/15/16   Domenick GongAshley Mortenson, MD  Lorcaserin HCl (BELVIQ) 10 MG TABS Take 10 mg by mouth 2 (two) times daily. Patient not taking: Reported on 08/24/2016 08/18/16   Shirline Freesory Nafziger, NP  mometasone (NASONEX) 50 MCG/ACT nasal spray Place 2 sprays into the nose daily. 10/15/16   Domenick GongAshley Mortenson, MD    Family History Family History  Problem Relation Age  of Onset  . Lung cancer Maternal Grandmother   . Hypertension Maternal Grandmother   . Mental illness Maternal Grandmother     unsure  . Prostate cancer Maternal Grandfather     unsure  . Hypertension Mother   . Mental illness Mother     unsure    Social History Social History  Substance Use Topics  . Smoking status: Never Smoker  . Smokeless tobacco: Never Used  . Alcohol use 0.0 oz/week     Comment: occ.      Allergies   Patient has no known allergies.   Review of Systems Review of Systems  All other systems reviewed and are negative.    Physical Exam Triage Vital Signs ED Triage Vitals  Enc Vitals Group     BP 11/10/16 1456 131/84     Pulse Rate 11/10/16 1456 88     Resp 11/10/16 1456 16     Temp 11/10/16 1456 98.4 F (36.9 C)     Temp Source 11/10/16 1456 Oral     SpO2 11/10/16 1456 100 %     Weight 11/10/16 1457 215 lb (97.5 kg)     Height 11/10/16 1457 5\' 4"  (1.626 m)     Head Circumference --      Peak Flow --      Pain Score 11/10/16 1458 6  Pain Loc --      Pain Edu? --      Excl. in GC? --    No data found.   Updated Vital Signs BP 131/84 (BP Location: Left Arm)   Pulse 88   Temp 98.4 F (36.9 C) (Oral)   Resp 16   Ht 5\' 4"  (1.626 m)   Wt 215 lb (97.5 kg)   LMP 10/21/2016 (Exact Date)   SpO2 100%   BMI 36.90 kg/m   Visual Acuity Right Eye Distance:   Left Eye Distance:   Bilateral Distance:    Right Eye Near:   Left Eye Near:    Bilateral Near:     Physical Exam  Constitutional: She appears well-developed and well-nourished. No distress.  HENT:  Head: Atraumatic.  Eyes: Pupils are equal, round, and reactive to light.  Neck: Normal range of motion.  Cardiovascular: Normal rate.   Pulmonary/Chest: Effort normal.  Musculoskeletal:       Feet:  Right foot has distinct tenderness to palpation dorsally as noted on diagram.  Distal neurovascular function is intact.  All toes have full range of motion.  Neurological: She is  alert.  Skin: Skin is warm and dry.  Nursing note and vitals reviewed.    UC Treatments / Results  Labs (all labs ordered are listed, but only abnormal results are displayed) Labs Reviewed - No data to display  EKG  EKG Interpretation None       Radiology Dg Foot Complete Right  Result Date: 11/10/2016 CLINICAL DATA:  25 year old female with acute right foot pain following injury today. Initial encounter. EXAM: RIGHT FOOT COMPLETE - 3+ VIEW COMPARISON:  None. FINDINGS: There is no evidence of fracture or dislocation. There is no evidence of arthropathy or other focal bone abnormality. Soft tissues are unremarkable. IMPRESSION: Negative. Electronically Signed   By: Harmon PierJeffrey  Hu M.D.   On: 11/10/2016 15:08    Procedures Procedures (including critical care time)  Medications Ordered in UC Medications - No data to display   Initial Impression / Assessment and Plan / UC Course  I have reviewed the triage vital signs and the nursing notes.  Pertinent labs & imaging results that were available during my care of the patient were reviewed by me and considered in my medical decision making (see chart for details).  Clinical Course   Ace wrap applied.  Rx for Lortab for pain. Apply ice pack for 30 minutes every 1 to 2 hours today and tomorrow.  Elevate.  Use crutches for 3 to 5 days.  Wear Ace wrap until swelling decreases.  Begin range of motion and stretching exercises in about 5 days as per instruction sheet.  May take Ibuprofen 200mg , 3 or 4 tabs every 8 hours with food.  Followup with Dr. Rodney Langtonhomas Thekkekandam or Dr. Clementeen GrahamEvan Corey ([facility]) if not improving about two weeks.      Final Clinical Impressions(s) / UC Diagnoses   Final diagnoses:  Right foot sprain, initial encounter    New Prescriptions New Prescriptions   HYDROCODONE-ACETAMINOPHEN (NORCO/VICODIN) 5-325 MG TABLET    Take 1 tablet by mouth every 6 (six) hours as needed for moderate pain.       Lattie HawStephen A Alishah Schulte, MD 11/20/16 2113

## 2016-11-10 NOTE — Discharge Instructions (Signed)
Apply ice pack for 30 minutes every 1 to 2 hours today and tomorrow.  Elevate.  Use crutches for 3 to 5 days.  Wear Ace wrap until swelling decreases.  Begin range of motion and stretching exercises in about 5 days as per instruction sheet.  May take Ibuprofen 200mg , 3 or 4 tabs every 8 hours with food.

## 2016-11-10 NOTE — ED Triage Notes (Signed)
Reports missing step while leaving work this morning and falling with a hyper-flexion of rigth foot. Took Ibuprofen 800mg  within past 1 hour. Came on crutches and transferred to w/c for triage.

## 2016-11-15 DIAGNOSIS — H0419 Other specified disorders of lacrimal gland: Secondary | ICD-10-CM | POA: Diagnosis not present

## 2016-12-19 MED FILL — NUVARING VAGINAL RING: 0.12-0.015 | 84 days supply | Qty: 3 | Fill #1

## 2017-01-10 ENCOUNTER — Ambulatory Visit: Payer: 59 | Admitting: Adult Health

## 2017-03-05 ENCOUNTER — Ambulatory Visit (INDEPENDENT_AMBULATORY_CARE_PROVIDER_SITE_OTHER): Payer: 59 | Admitting: Family Medicine

## 2017-03-05 ENCOUNTER — Encounter: Payer: Self-pay | Admitting: Family Medicine

## 2017-03-05 VITALS — BP 128/88 | HR 90 | Temp 98.4°F | Wt 220.2 lb

## 2017-03-05 DIAGNOSIS — J3089 Other allergic rhinitis: Secondary | ICD-10-CM | POA: Diagnosis not present

## 2017-03-05 DIAGNOSIS — G8929 Other chronic pain: Secondary | ICD-10-CM

## 2017-03-05 DIAGNOSIS — M545 Low back pain: Secondary | ICD-10-CM

## 2017-03-05 LAB — CBC WITH DIFFERENTIAL/PLATELET
BASOS PCT: 0.6 % (ref 0.0–3.0)
Basophils Absolute: 0 10*3/uL (ref 0.0–0.1)
EOS PCT: 6.1 % — AB (ref 0.0–5.0)
Eosinophils Absolute: 0.4 10*3/uL (ref 0.0–0.7)
HCT: 44.2 % (ref 36.0–46.0)
Hemoglobin: 14.3 g/dL (ref 12.0–15.0)
Lymphocytes Relative: 45.8 % (ref 12.0–46.0)
Lymphs Abs: 3.3 10*3/uL (ref 0.7–4.0)
MCHC: 32.3 g/dL (ref 30.0–36.0)
MCV: 86.5 fl (ref 78.0–100.0)
MONO ABS: 0.5 10*3/uL (ref 0.1–1.0)
Monocytes Relative: 6.8 % (ref 3.0–12.0)
Neutro Abs: 2.9 10*3/uL (ref 1.4–7.7)
Neutrophils Relative %: 40.7 % — ABNORMAL LOW (ref 43.0–77.0)
PLATELETS: 296 10*3/uL (ref 150.0–400.0)
RBC: 5.1 Mil/uL (ref 3.87–5.11)
RDW: 13.6 % (ref 11.5–15.5)
WBC: 7.1 10*3/uL (ref 4.0–10.5)

## 2017-03-05 LAB — BASIC METABOLIC PANEL
BUN: 10 mg/dL (ref 6–23)
CALCIUM: 9.6 mg/dL (ref 8.4–10.5)
CO2: 29 mEq/L (ref 19–32)
CREATININE: 0.78 mg/dL (ref 0.40–1.20)
Chloride: 103 mEq/L (ref 96–112)
GFR: 95.67 mL/min (ref >60.00)
Glucose, Bld: 111 mg/dL — ABNORMAL HIGH (ref 70–99)
Potassium: 4.3 mEq/L (ref 3.5–5.1)
SODIUM: 141 meq/L (ref 135–145)

## 2017-03-05 MED ORDER — DICLOFENAC SODIUM 75 MG PO TBEC: 75.0000 mg | DELAYED_RELEASE_TABLET | Freq: Two times a day (BID) | ORAL | 0 refills | Status: DC

## 2017-03-05 MED FILL — DICLOFENAC SOD 75 MG TAB EC: 75 | 15 days supply | Qty: 30 | Fill #0

## 2017-03-05 NOTE — Patient Instructions (Addendum)
It was a pleasure to see you today.  We have ordered labs or studies at this visit. It can take up to 1-2 weeks for results and processing. IF results require follow up or explanation, we will call you with instructions. Clinically stable results will be released to your [facility]. If you have not heard from us or cannot find your results in [facility] in 2 weeks please contact our office at [phone].  If you are not yet signed up for [facility], please consider signing up   [facility] if symptoms of back pain do not improve with treatment, worsen, or you develop new symptoms.   Back Pain, Adult Back pain is very common. The pain often gets better over time. The cause of back pain is usually not dangerous. Most people can learn to manage their back pain on their own. Follow these instructions at home: Watch your back pain for any changes. The following actions may help to lessen any pain you are feeling:  Stay active. Start with short walks on flat ground if you can. Try to walk farther each day.  Exercise regularly as told by your doctor. Exercise helps your back heal faster. It also helps avoid future injury by keeping your muscles strong and flexible.  Do not sit, drive, or stand in one place for more than 30 minutes.  Do not stay in bed. Resting more than 1-2 days can slow down your recovery.  Be careful when you bend or lift an object. Use good form when lifting:  Bend at your knees.  Keep the object close to your body.  Do not twist.  Sleep on a firm mattress. Lie on your side, and bend your knees. If you lie on your back, put a pillow under your knees.  Take medicines only as told by your doctor.  Put ice on the injured area.  Put ice in a plastic bag.  Place a towel between your skin and the bag.  Leave the ice on for 20 minutes, 2-3 times a day for the first 2-3 days. After that, you can switch between ice and heat packs.  Avoid feeling anxious or stressed. Find  good ways to deal with stress, such as exercise.  Maintain a healthy weight. Extra weight puts stress on your back. Contact a doctor if:  You have pain that does not go away with rest or medicine.  You have worsening pain that goes down into your legs or buttocks.  You have pain that does not get better in one week.  You have pain at night.  You lose weight.  You have a fever or chills. Get help right away if:  You cannot control when you poop (bowel movement) or pee (urinate).  Your arms or legs feel weak.  Your arms or legs lose feeling (numbness).  You feel sick to your stomach (nauseous) or throw up (vomit).  You have belly (abdominal) pain.  You feel like you may pass out (faint). This information is not intended to replace advice given to you by your health care provider. Make sure you discuss any questions you have with your health care provider. Document Released: 05/22/2008 Document Revised: 05/11/2016 Document Reviewed: 04/07/2014 Elsevier Interactive Patient Education  2017 [employer] Inc[employer]   WE NOW OFFER   [facility] FAST TRACK!!!  SAME DAY Appointments for ACUTE CARE  Such as: Sprains, Injuries, cuts, abrasions, rashes, muscle pain, joint pain, back pain Colds, flu, sore throats, headache, allergies, cough, fever  Ear pain, sinus and eye infections Abdominal pain, nausea, vomiting, diarrhea, upset stomach Animal/insect bites  3 Easy Ways to Schedule: Walk-In Scheduling Call in scheduling Mychart Sign-up: [email or web address]

## 2017-03-05 NOTE — Progress Notes (Signed)
Subjective:    Patient ID: Jasmine Coleman, female    DOB: November 18, 1991, 26 y.o.   MRN: [id number]  HPI  BACK PAIN  Location: lower back Quality: can be dull or can be sharp  Onset: 2 weeks; triggered when working 12 hour shifts Worse with: Activity   Better with:  Nothing with exception of chiropractor treatment.     Radiation: Can radiate down legs Trauma: Prior history of fall in December with a sprain however pain did not start until 2 weeks.  Best sitting/standing/leaning forward: No Treatment at a chiropractor has provided limited benefit.  Red Flags Fecal/urinary incontinence: No Numbness/Weakness: No Fever/chills/sweats: No Night pain:  No Unexplained weight loss: No Relief with bedrest:   h/o cancer/immunosuppression:  No IV drug use:  No PMH of osteoporosis or chronic steroid use: No  Nasal congestion and sinus pressure with post nasal drip that have been worsening during change of seasons. No treatment at home with exception of a short term therapy of Zyrtec which provided limited benefit.   Review of Systems  Constitutional: Negative for chills and fever.  HENT: Positive for rhinorrhea, sinus pain and sinus pressure. Negative for congestion, sneezing and sore throat.   Respiratory: Negative for cough, shortness of breath and wheezing.   Cardiovascular: Negative for chest pain, palpitations and leg swelling.  Gastrointestinal: Negative for abdominal pain, diarrhea, nausea and vomiting.  Genitourinary: Negative for dysuria, frequency, hematuria and urgency.  Musculoskeletal: Positive for back pain.  Skin: Negative for rash.  Neurological: Negative for dizziness, light-headedness and headaches.  Psychiatric/Behavioral:       Denies depressed or anxious mood   Past Medical History:  Diagnosis Date  . Chicken pox   . Depression   . Hypertension    high blood pressure readings after pregnancy  . S/P thyroid biopsy      Social History   Social History    . Marital status: Married    Spouse name: N/A  . Number of children: N/A  . Years of education: N/A   Occupational History  . Not on file.   Social History Main Topics  . Smoking status: Never Smoker  . Smokeless tobacco: Never Used  . Alcohol use 0.0 oz/week     Comment: occ.   . Drug use: No  . Sexual activity: Yes    Birth control/ protection: IUD   Other Topics Concern  . 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 BIOPSY THYROID    . Wisdome Tooth Extraction      Family History  Problem Relation Age of Onset  . Lung cancer Maternal Grandmother   . Hypertension Maternal Grandmother   . Mental illness Maternal Grandmother     unsure  . Prostate cancer Maternal Grandfather     unsure  . Hypertension Mother   . Mental illness Mother     unsure    No Known Allergies  Current Outpatient Prescriptions on File Prior to Visit  Medication Sig Dispense Refill  . ibuprofen (ADVIL,MOTRIN) 800 MG tablet Take 1 tablet (800 mg total) by mouth 3 (three) times daily. 30 tablet 0  . mometasone (NASONEX) 50 MCG/ACT nasal spray Place 2 sprays into the nose daily. (Patient not taking: Reported on 03/05/2017) 17 g 0  No current facility-administered medications on file prior to visit.     BP 128/88 (BP Location: Left Arm, Patient Position: Sitting, Cuff Size: Normal)   Pulse 90   Temp 98.4 F (36.9 C) (Oral)   Wt 220 lb 3.2 oz (99.9 kg)   SpO2 98%   BMI 37.80 kg/m        Objective:   Physical Exam  Constitutional: She is oriented to person, place, and time. She appears well-developed and well-nourished.  Obese female  Eyes: Pupils are equal, round, and reactive to light. No scleral icterus.  Neck: Neck supple.  Cardiovascular: Normal rate and regular rhythm.   Pulmonary/Chest: Effort normal and breath sounds normal. She has no  wheezes. She has no rales.  Abdominal: Soft. Bowel sounds are normal. There is no tenderness. There is no rebound.  Musculoskeletal: She exhibits no edema.  Spine with normal alignment and no deformity. No tenderness to vertebral process or paraspinous muscles with palpation. ROM is full at lumbar sacral regions. Negative Straight Leg raise. No CVA tenderness present. Able to heel/toe walk without pain.  Lymphadenopathy:    She has no cervical adenopathy.  Neurological: She is alert and oriented to person, place, and time.  Skin: Skin is warm and dry. No rash noted.  Psychiatric: She has a normal mood and affect. Her behavior is normal. Judgment and thought content normal.        Assessment & Plan:  1. Chronic low back pain, unspecified back pain laterality, with sciatica presence unspecified Controlled today; symptoms aggravated by working long hours. Advised use of good footwear, alternating between standing and sitting when working extended hours. We discussed short term use of diclofenac for symptoms and follow up with PCP if symptoms do not improve with treatment, worsen, or she develops new symptoms.  We also discussed that imaging and PT may be indicated if symptoms progress.  Diclofenac prescribed today  Patient requesting lab work of BMP for symptoms and after a lengthy discussion of reasons for obtaining lab work, we agreed to obtain a CBC and BMP today as patient is concerned that her symptoms may not be attributed to information above. Further advised patient that weight loss and exercise especially stretches for her low back can be beneficial for her.  2. Allergic rhinitis due to other allergic trigger, unspecified chronicity, unspecified seasonality Post nasal drip and sinus pressure and rhinitis present.  Advised use of Allegra, Claritin, or Zyrtec for symptoms as needed.  Follow up with Kandee Keen in 2 to 3 weeks after trial of diclofenac.  Roddie Mc, FNP-C

## 2017-03-05 NOTE — Progress Notes (Signed)
Pre visit review using our clinic review tool, if applicable. No additional management support is needed unless otherwise documented below in the visit note. 

## 2017-03-09 MED FILL — NUVARING VAGINAL RING: 0.12-0.015 | 84 days supply | Qty: 3 | Fill #2

## 2017-03-21 ENCOUNTER — Ambulatory Visit (INDEPENDENT_AMBULATORY_CARE_PROVIDER_SITE_OTHER): Payer: 59 | Admitting: Adult Health

## 2017-03-21 ENCOUNTER — Ambulatory Visit (INDEPENDENT_AMBULATORY_CARE_PROVIDER_SITE_OTHER)
Admission: RE | Admit: 2017-03-21 | Discharge: 2017-03-21 | Disposition: A | Discharge status: Home / Self Care | Payer: 59 | Source: Ambulatory Visit | Attending: Adult Health | Admitting: Adult Health

## 2017-03-21 ENCOUNTER — Encounter: Payer: Self-pay | Admitting: Adult Health

## 2017-03-21 VITALS — BP 132/78 | Ht 64.0 in | Wt 218.1 lb

## 2017-03-21 DIAGNOSIS — M549 Dorsalgia, unspecified: Secondary | ICD-10-CM

## 2017-03-21 DIAGNOSIS — M545 Low back pain: Secondary | ICD-10-CM | POA: Diagnosis not present

## 2017-03-21 DIAGNOSIS — M542 Cervicalgia: Secondary | ICD-10-CM | POA: Diagnosis not present

## 2017-03-21 MED ORDER — CYCLOBENZAPRINE HCL 10 MG PO TABS: 10.0000 mg | ORAL_TABLET | Freq: Every day | ORAL | 0 refills | Status: DC

## 2017-03-21 MED ORDER — METHYLPREDNISOLONE 4 MG PO TBPK: ORAL_TABLET | ORAL | 0 refills | Status: DC

## 2017-03-21 MED FILL — CYCLOBENZAPRINE 10 MG TAB: 10 | 10 days supply | Qty: 10 | Fill #0

## 2017-03-21 MED FILL — METHYLPREDNISOLONE 4 MG TAB: 4 | 6 days supply | Qty: 21 | Fill #0

## 2017-03-21 NOTE — Progress Notes (Signed)
Subjective:    Patient ID: Jasmine Coleman, female    DOB: [birth year], 26 y.o.   MRN: [id number]  HPI  26 year old female who presents to the office today for continued back pain. She was seen by Delbert Harness, NP on 3.19.2018 for this complaint. She was prescribed a course of Voltaren 75 mg. She reports that this did not help with her pain. Pain is located in her cervical and lumbar spine. She does report intermittent episodes of sciatica down the left leg. She has seen a chiropractor in the past for " adjusting" but did not find this overly helpful. 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   She denies any fecal or urinary incontinence, numbness and tingling, or fever/chills.   Review of Systems See HPI   Past Medical History:  Diagnosis Date  . Chicken pox   . Depression   . Hypertension    high blood pressure readings after pregnancy  . S/P thyroid biopsy     Social History   Social History  . Marital status: Married    Spouse name: N/A  . Number of children: N/A  . Years of education: N/A   Occupational History  . Not on file.   Social History Main Topics  . Smoking status: Never Smoker  . Smokeless tobacco: Never Used  . Alcohol use 0.0 oz/week     Comment: occ.   . Drug use: No  . Sexual activity: Yes    Birth control/ protection: IUD   Other Topics Concern  . 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 BIOPSY THYROID    . Wisdome Tooth Extraction      Family History  Problem Relation Age of Onset  . Lung cancer Maternal Grandmother   . Hypertension Maternal Grandmother   . Mental illness Maternal Grandmother     unsure  . Prostate cancer Maternal  Grandfather     unsure  . Hypertension Mother   . Mental illness Mother     unsure    No Known Allergies  Current Outpatient Prescriptions on File Prior to Visit  Medication Sig Dispense Refill  . diclofenac (VOLTAREN) 75 MG EC tablet Take 1 tablet (75 mg total) by mouth 2 (two) times daily. 30 tablet 0  . ibuprofen (ADVIL,MOTRIN) 800 MG tablet Take 1 tablet (800 mg total) by mouth 3 (three) times daily. 30 tablet 0  . mometasone (NASONEX) 50 MCG/ACT nasal spray Place 2 sprays into the nose daily. 17 g 0   No current facility-administered medications on file prior to visit.     BP 132/78 (BP Location: Right Arm, Patient Position: Sitting, Cuff Size: Normal)   Ht  (1.626 m)   Wt 218 lb 1.6 oz (98.9 kg)   BMI 37.44 kg/m       Objective:   Physical Exam  Constitutional: She is oriented to person, place, and time. She appears well-developed and well-nourished. No distress.  Cardiovascular: Normal rate, regular rhythm, normal heart sounds and intact distal pulses.  Exam reveals no gallop and no  friction rub.   No murmur heard. Pulmonary/Chest: Effort normal and breath sounds normal. No respiratory distress. She has no wheezes. She has no rales. She exhibits no tenderness.  Musculoskeletal: Normal range of motion. She exhibits no edema, tenderness or deformity.  No spinal tenderness or stepoff. Pain in lower back with right side straight leg raise  Neurological: She is alert and oriented to person, place, and time. She has normal reflexes. She displays normal reflexes. No cranial nerve deficit. She exhibits normal muscle tone. Coordination normal.  Skin: Skin is warm and dry. No rash noted. She is not diaphoretic. No erythema. No pallor.  Psychiatric: She has a normal mood and affect. Her behavior is normal. Judgment and thought content normal.  Nursing note and vitals reviewed.     Assessment & Plan:  1. Other acute back pain - Will trial short course of muscle relaxer and  prednisone  - methylPREDNISolone (MEDROL DOSEPAK) 4 MG TBPK tablet; Take as directed  Dispense: 21 tablet; Refill: 0 - cyclobenzaprine (FLEXERIL) 10 MG tablet; Take 1 tablet (10 mg total) by mouth at bedtime.  Dispense: 10 tablet; Refill: 0 - DG Cervical Spine Complete; Future - DG Lumbar Spine Complete; Future - Ambulatory referral to Physical Therapy - Consider referral to sports med or orthopedics   Shirline Frees, NP

## 2017-04-16 ENCOUNTER — Telehealth: Payer: Self-pay | Admitting: Adult Health

## 2017-04-16 NOTE — Telephone Encounter (Signed)
Pt would like to restarted taking phentermine 30 mg [place] out pt pharm. Pt back xray was good and she would like to know if she should have physical therapy

## 2017-04-17 ENCOUNTER — Other Ambulatory Visit: Payer: Self-pay | Admitting: Adult Health

## 2017-04-17 ENCOUNTER — Other Ambulatory Visit: Payer: Self-pay

## 2017-04-17 DIAGNOSIS — M546 Pain in thoracic spine: Secondary | ICD-10-CM

## 2017-04-17 MED ORDER — PHENTERMINE HCL 30 MG PO CAPS: 30.0000 mg | ORAL_CAPSULE | ORAL | 0 refills | Status: DC

## 2017-04-17 MED FILL — PHENTERMINE HCL 30 MG CAP: 30 | 30 days supply | Qty: 30 | Fill #0

## 2017-04-17 NOTE — Telephone Encounter (Signed)
Left message for patient to return phone call, also need to verify pharmacy.

## 2017-04-17 NOTE — Telephone Encounter (Signed)
Ok to refill phentermine for one month. Have her follow up in a month.   Ok for PT order of back pain.

## 2017-04-17 NOTE — Telephone Encounter (Signed)
Patient called back and wanted prescription sent to [place]. Rx has been printed and faxed.

## 2017-04-17 NOTE — Telephone Encounter (Signed)
Patient has been notified of rx refill, follow up appointment, and PT referral. Patient states she will call back to schedule follow up appointment.

## 2017-04-17 NOTE — Telephone Encounter (Signed)
Please advise 

## 2017-04-26 IMAGING — DX DG FOOT COMPLETE 3+V*R*
3 series · 3 of 3 positions shown · non-contrast
Comparison: None.

CLINICAL DATA: 25-year-old female with acute right foot pain
following injury today. Initial encounter.

EXAM:
RIGHT FOOT COMPLETE - 3+ VIEW

[foot ap]
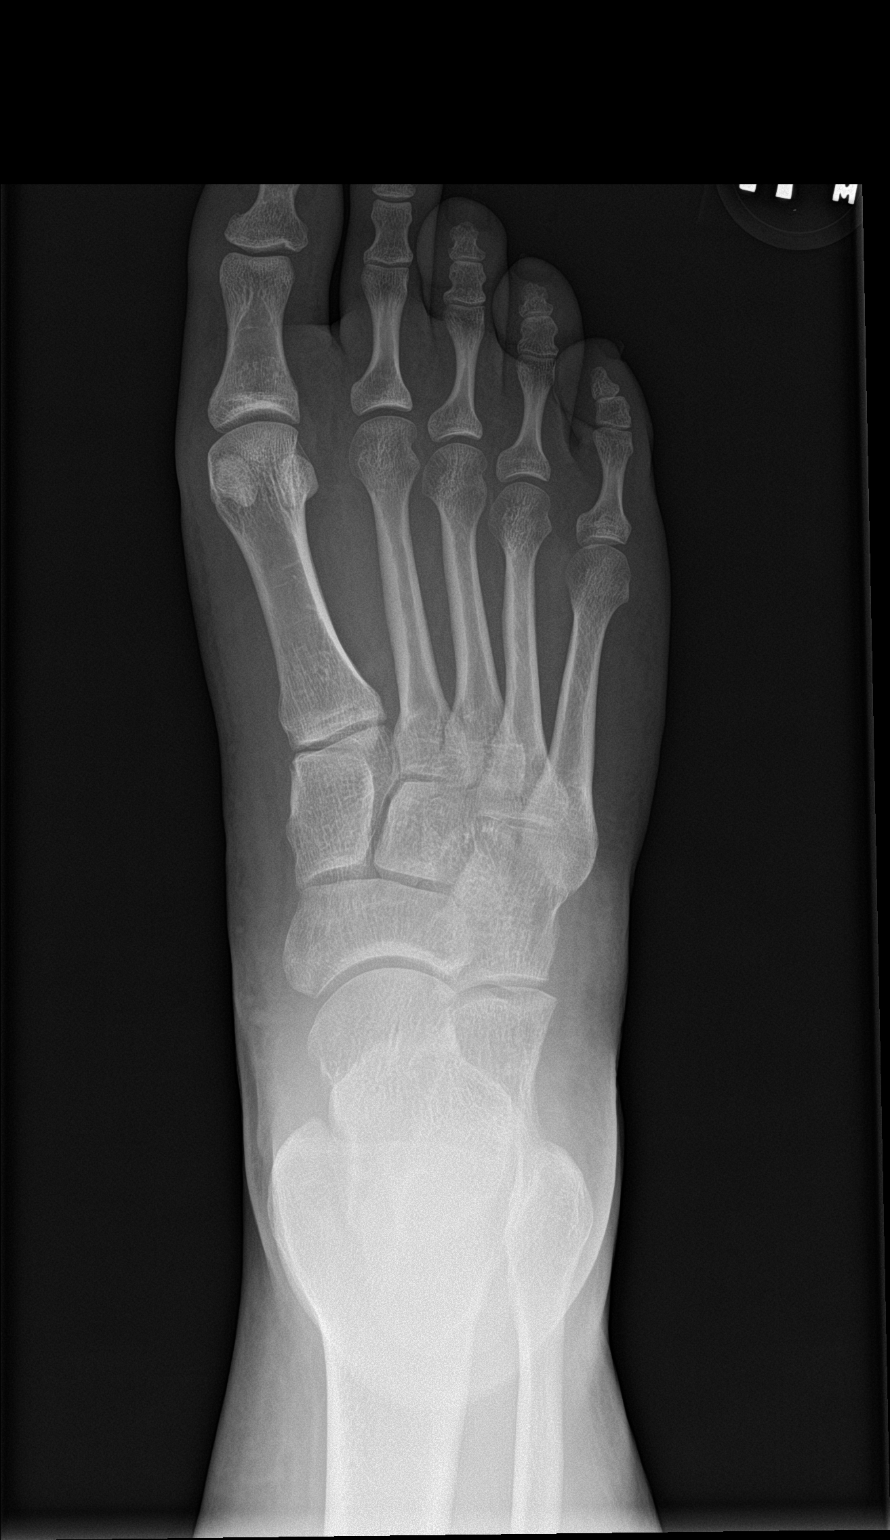

[foot obl]
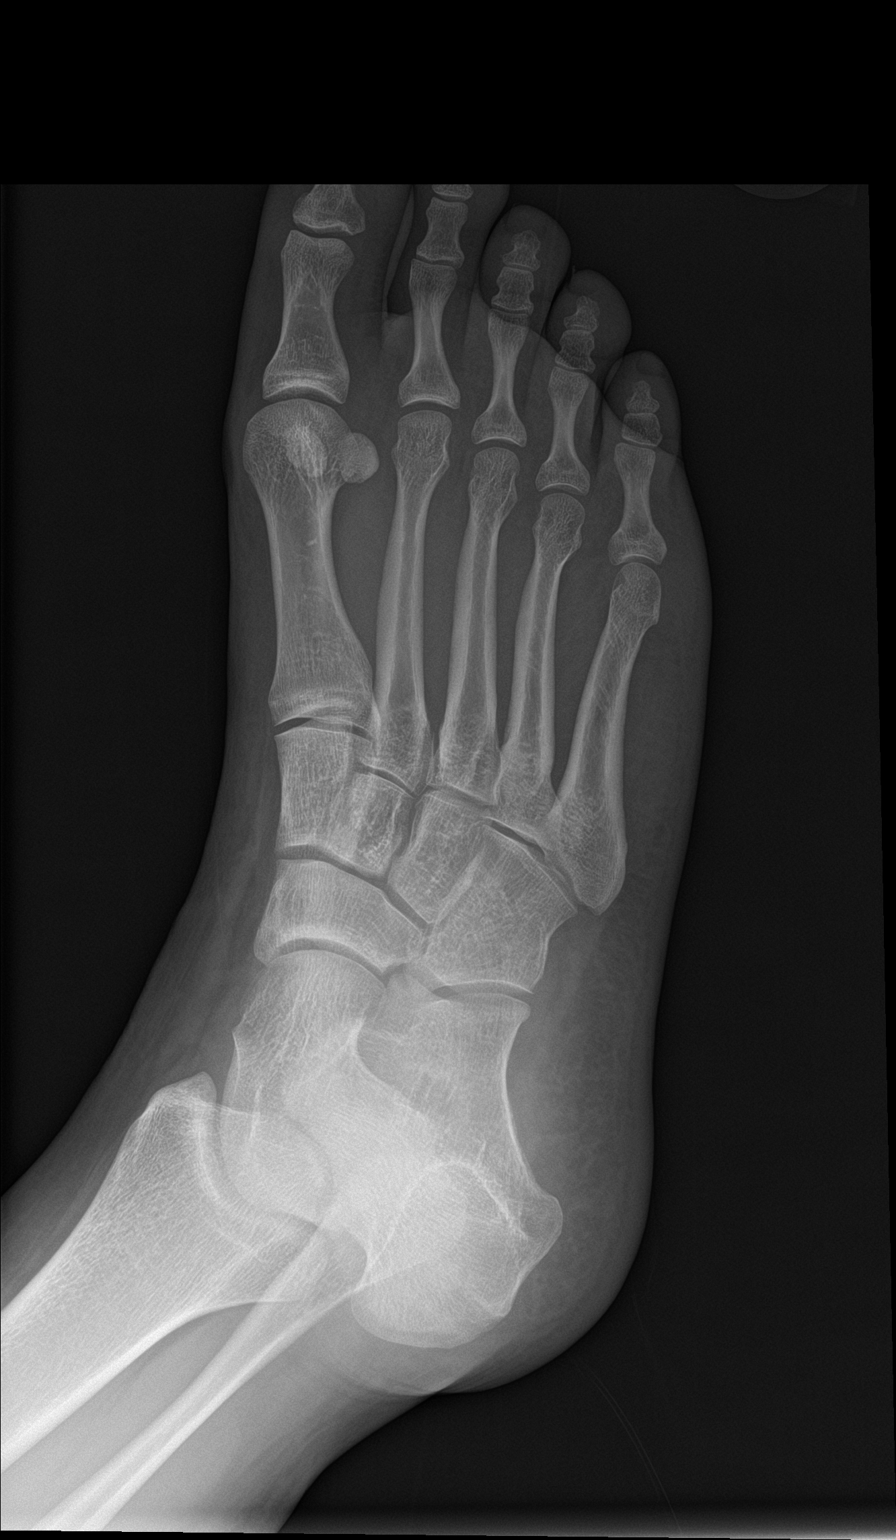

[foot lat]
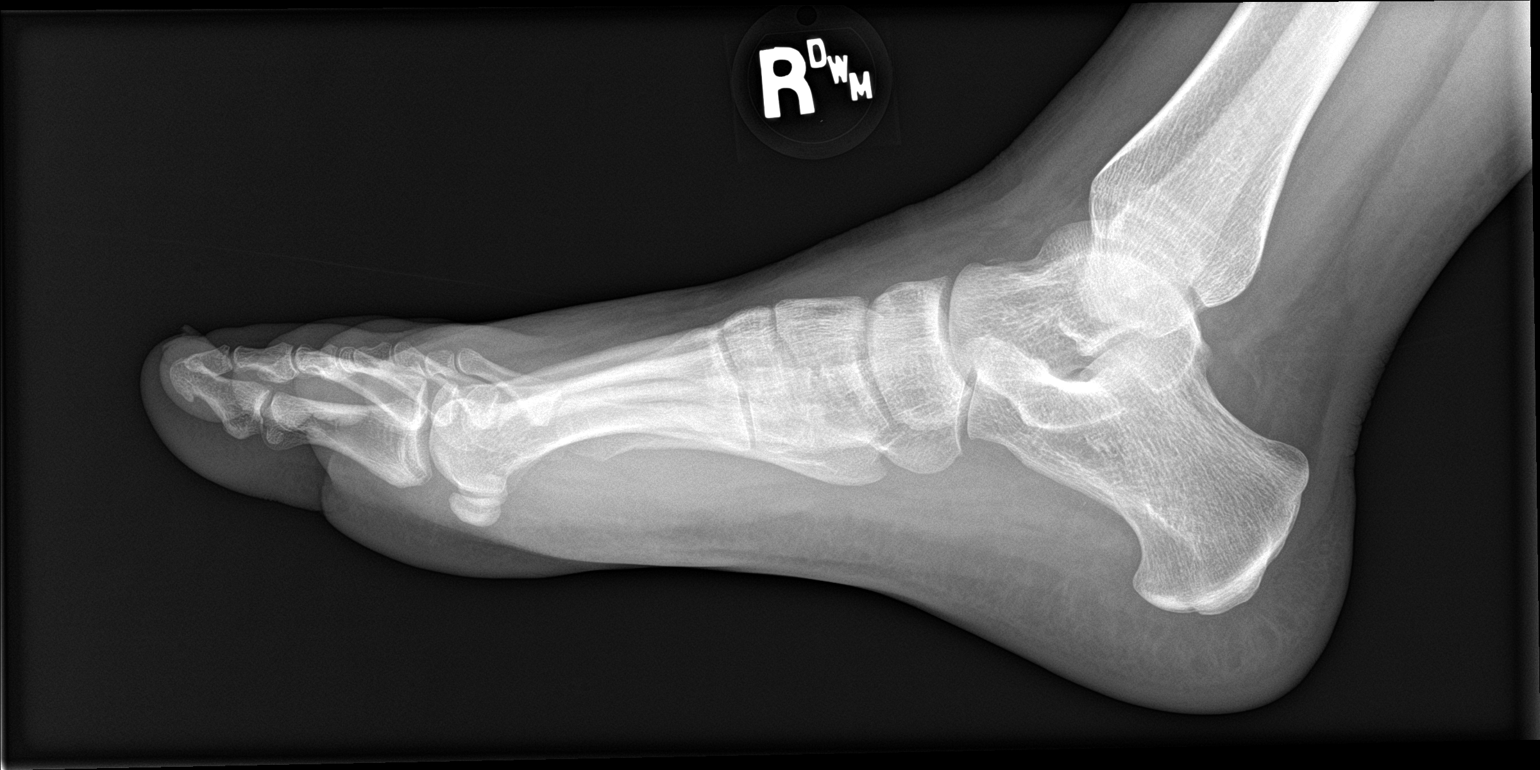

[3 of 3 positions shown; findings below may reference images not displayed]

FINDINGS: There is no evidence of fracture or dislocation. There is no
evidence of arthropathy or other focal bone abnormality. Soft
tissues are unremarkable.
IMPRESSION: Negative.

## 2017-05-16 DIAGNOSIS — H0419 Other specified disorders of lacrimal gland: Secondary | ICD-10-CM | POA: Diagnosis not present

## 2017-05-16 MED FILL — NEO/POLYMYXIN/DEXAMETH DROP: 3.5-10000-0 | 25 days supply | Qty: 5 | Fill #0

## 2017-06-07 ENCOUNTER — Ambulatory Visit (HOSPITAL_COMMUNITY)
Admission: EM | Admit: 2017-06-07 | Discharge: 2017-06-07 | Disposition: A | Discharge status: Home / Self Care | Payer: 59 | Attending: Family Medicine | Admitting: Family Medicine

## 2017-06-07 ENCOUNTER — Encounter (HOSPITAL_COMMUNITY): Payer: Self-pay | Admitting: Emergency Medicine

## 2017-06-07 DIAGNOSIS — F458 Other somatoform disorders: Secondary | ICD-10-CM | POA: Diagnosis not present

## 2017-06-07 DIAGNOSIS — R0989 Other specified symptoms and signs involving the circulatory and respiratory systems: Secondary | ICD-10-CM

## 2017-06-07 NOTE — ED Triage Notes (Signed)
Last night felt like something got stuck in throat. Patient was eating chicken wings.   Patient did have 2 episodes of vomiting last night.  Today it is getting more and more difficult to swallow.  Patient has been drinking liquids without difficulty.  Patient reports solids ar very painful

## 2017-06-08 MED FILL — NUVARING VAGINAL RING: 0.12-0.015 | 84 days supply | Qty: 3 | Fill #3

## 2017-06-09 NOTE — ED Provider Notes (Signed)
  [facility]   [id number] 06/07/17 Arrival Time: 1819  ASSESSMENT & PLAN:  1. Globus sensation    Information given. Trial of OTC Prilosec for the next 5-7 days. If symptom does not resolve, I recommend ENT evaluation. Reviewed expectations re: course of current medical issues. Questions answered. Outlined signs and symptoms indicating need for more acute intervention. Follow up here or in the Emergency Department if worsening. Patient verbalized understanding. After Visit Summary given.    SUBJECTIVE:  Jasmine Coleman is a 26 y.o. female who presents with complaint of feeling like something is stuck in her throat, "a lump". Several days. Normal PO intake; able to swallow without problem "but still feel like something's there when I swallow". No neck swelling. No throat pain or pain when swallowing. No resp symptoms.  Thinks she might have notice sensation after meal of chicken wings. Not entirely sure. No self treatment. Able to sleep well.  ROS: As per HPI.   OBJECTIVE:  Vitals:   06/07/17 1854  BP: 119/75  Pulse: 94  Resp: 18  Temp: 98.5 F (36.9 C)  TempSrc: Oral  SpO2: 97%     General appearance: alert, cooperative, appears stated age and no distress Head: Normocephalic, without obvious abnormality, atraumatic Eyes: conjunctivae/corneas clear. PERRL, EOM's intact. Ears: normal TM's and external ear canals both ears Nose: Nares normal. Mucosa normal. Throat: lips, mucosa, and tongue normal; teeth and gums normal Neck: no adenopathy and supple, symmetrical, trachea midline Skin: Skin color, texture, turgor normal. No rashes or lesions Lymph nodes: no lymphadenopathy Neurologic: Alert and oriented X 3, normal Coleman and tone. Normal symmetric reflexes. Normal gait   No Known Allergies  PMHx, SurgHx, SocialHx, Medications, and Allergies were reviewed in the Visit Navigator and updated as appropriate.       Mardella LaymanHagler, Jasmine Carattini, MD 06/09/17  [phone]

## 2017-07-13 DIAGNOSIS — Z3480 Encounter for supervision of other normal pregnancy, unspecified trimester: Secondary | ICD-10-CM | POA: Diagnosis not present

## 2017-07-13 DIAGNOSIS — Z113 Encounter for screening for infections with a predominantly sexual mode of transmission: Secondary | ICD-10-CM | POA: Diagnosis not present

## 2017-07-13 DIAGNOSIS — R350 Frequency of micturition: Secondary | ICD-10-CM | POA: Diagnosis not present

## 2017-07-13 DIAGNOSIS — Z01419 Encounter for gynecological examination (general) (routine) without abnormal findings: Secondary | ICD-10-CM | POA: Diagnosis not present

## 2017-07-13 DIAGNOSIS — B373 Candidiasis of vulva and vagina: Secondary | ICD-10-CM | POA: Diagnosis not present

## 2017-07-13 MED FILL — FLUCONAZOLE 150 MG TABLET: 150 | 3 days supply | Qty: 2 | Fill #0

## 2017-07-16 ENCOUNTER — Encounter: Payer: Self-pay | Admitting: Adult Health

## 2017-07-17 MED FILL — FLUCONAZOLE 150 MG TABLET: 150 | 6 days supply | Qty: 2 | Fill #0

## 2017-08-27 MED FILL — NUVARING VAGINAL RING: 0.12-0.015 | 84 days supply | Qty: 3 | Fill #0

## 2017-09-17 ENCOUNTER — Telehealth: Payer: Self-pay | Admitting: *Deleted

## 2017-09-17 ENCOUNTER — Ambulatory Visit (INDEPENDENT_AMBULATORY_CARE_PROVIDER_SITE_OTHER): Payer: 59 | Admitting: Family Medicine

## 2017-09-17 ENCOUNTER — Encounter: Payer: Self-pay | Admitting: Family Medicine

## 2017-09-17 ENCOUNTER — Other Ambulatory Visit: Payer: Self-pay | Admitting: Family Medicine

## 2017-09-17 VITALS — BP 128/80 | HR 75 | Temp 98.4°F | Resp 12 | Ht 64.0 in | Wt 218.4 lb

## 2017-09-17 DIAGNOSIS — R519 Headache, unspecified: Secondary | ICD-10-CM

## 2017-09-17 DIAGNOSIS — R51 Headache: Secondary | ICD-10-CM

## 2017-09-17 DIAGNOSIS — K219 Gastro-esophageal reflux disease without esophagitis: Secondary | ICD-10-CM

## 2017-09-17 DIAGNOSIS — R1310 Dysphagia, unspecified: Secondary | ICD-10-CM

## 2017-09-17 DIAGNOSIS — N926 Irregular menstruation, unspecified: Secondary | ICD-10-CM | POA: Diagnosis not present

## 2017-09-17 DIAGNOSIS — M542 Cervicalgia: Secondary | ICD-10-CM

## 2017-09-17 LAB — BASIC METABOLIC PANEL
BUN: 8 mg/dL (ref 6–23)
CALCIUM: 9.2 mg/dL (ref 8.4–10.5)
CO2: 26 meq/L (ref 19–32)
CREATININE: 0.67 mg/dL (ref 0.40–1.20)
Chloride: 103 mEq/L (ref 96–112)
GFR: 113.52 mL/min (ref >60.00)
Glucose, Bld: 93 mg/dL (ref 70–99)
Potassium: 4.3 mEq/L (ref 3.5–5.1)
Sodium: 139 mEq/L (ref 135–145)

## 2017-09-17 LAB — CBC WITH DIFFERENTIAL/PLATELET
BASOS PCT: 0.4 % (ref 0.0–3.0)
Basophils Absolute: 0 10*3/uL (ref 0.0–0.1)
EOS PCT: 3.1 % (ref 0.0–5.0)
Eosinophils Absolute: 0.2 10*3/uL (ref 0.0–0.7)
HCT: 41.4 % (ref 36.0–46.0)
Hemoglobin: 13.6 g/dL (ref 12.0–15.0)
LYMPHS ABS: 1.8 10*3/uL (ref 0.7–4.0)
Lymphocytes Relative: 26.4 % (ref 12.0–46.0)
MCHC: 32.8 g/dL (ref 30.0–36.0)
MCV: 87.2 fl (ref 78.0–100.0)
Monocytes Absolute: 0.3 10*3/uL (ref 0.1–1.0)
Monocytes Relative: 5 % (ref 3.0–12.0)
NEUTROS ABS: 4.3 10*3/uL (ref 1.4–7.7)
NEUTROS PCT: 65.1 % (ref 43.0–77.0)
Platelets: 282 10*3/uL (ref 150.0–400.0)
RBC: 4.75 Mil/uL (ref 3.87–5.11)
RDW: 13 % (ref 11.5–15.5)
WBC: 6.6 10*3/uL (ref 4.0–10.5)

## 2017-09-17 LAB — POCT URINE PREGNANCY: Preg Test, Ur: NEGATIVE

## 2017-09-17 MED ORDER — KETOROLAC TROMETHAMINE 60 MG/2ML IM SOLN
60.0000 mg | Freq: Once | INTRAMUSCULAR | Status: AC
  Administered 2017-09-17: 60 mg via INTRAMUSCULAR

## 2017-09-17 MED ORDER — PANTOPRAZOLE SODIUM 40 MG PO PACK: 20.0000 mg | PACK | Freq: Two times a day (BID) | ORAL | 0 refills | Status: DC

## 2017-09-17 MED ORDER — ESOMEPRAZOLE MAGNESIUM 40 MG PO PACK: 40.0000 mg | PACK | Freq: Every day | ORAL | 1 refills | Status: DC

## 2017-09-17 NOTE — Patient Instructions (Signed)
[name] I have seen you today for an acute visit.  A few things to remember from today's visit:   Dysphagia, unspecified type - Plan: esomeprazole (NEXIUM) 40 MG packet  Gastroesophageal reflux disease, esophagitis presence not specified - Plan: esomeprazole (NEXIUM) 40 MG packet  Headache, unspecified headache type - Plan: CBC with Differential/Platelet, Basic metabolic panel  Cervicalgia  Missed menses    Avoid foods that make your symptoms worse, for example coffee, chocolate,pepermeint,alcohol, and greasy food. Raising the head of your bed about 6 inches may help with nocturnal symptoms.  Avoid tobacco use. Weight loss (if you are overweight). Avoid lying down for 3 hours after eating.  Instead 3 large meals daily try small and more frequent meals during the day.  Every medication have side effects and medications for GERD are not the exception.At this time I think benefit is greater than risk.    You should be evaluated immediately if bloody vomiting, bloody stools, black stools (like tar), difficulty swallowing, food gets stuck on the way down or choking when eating. Abnormal weight loss or severe abdominal pain.  If symptoms are not resolved sometimes endoscopy is necessary.   General Headache Without Cause A headache is pain or discomfort felt around the head or neck area. There are many causes and types of headaches. In some cases, the cause may not be found. Follow these instructions at home: Managing pain  Take over-the-counter and prescription medicines only as told by your doctor.  Lie down in a dark, quiet room when you have a headache.  If directed, apply ice to the head and neck area: ? Put ice in a plastic bag. ? Place a towel between your skin and the bag. ? Leave the ice on for 20 minutes, 2-3 times per day.  Use a heating pad or hot shower to apply heat to the head and neck area as told by your doctor.  Keep lights dim if bright  lights bother you or make your headaches worse. Eating and drinking  Eat meals on a regular schedule.  Lessen how much alcohol you drink.  Lessen how much caffeine you drink, or stop drinking caffeine. General instructions  Keep all follow-up visits as told by your doctor. This is important.  Keep a journal to find out if certain things bring on headaches. For example, write down: ? What you eat and drink. ? How much sleep you get. ? Any change to your diet or medicines.  Relax by getting a massage or doing other relaxing activities.  Lessen stress.  Sit up straight. Do not tighten (tense) your muscles.  Do not use tobacco products. This includes cigarettes, chewing tobacco, or e-cigarettes. If you need help quitting, ask your doctor.  Exercise regularly as told by your doctor.  Get enough sleep. This often means 7-9 hours of sleep. Contact a doctor if:  Your symptoms are not helped by medicine.  You have a headache that feels different than the other headaches.  You feel sick to your stomach (nauseous) or you throw up (vomit).  You have a fever. Get help right away if:  Your headache becomes really bad.  You keep throwing up.  You have a stiff neck.  You have trouble seeing.  You have trouble speaking.  You have pain in the eye or ear.  Your muscles are weak or you lose muscle control.  You lose your balance or have trouble walking.  You feel like you will pass out (faint)  or you pass out.  You have confusion. This information is not intended to replace advice given to you by your health care provider. Make sure you discuss any questions you have with your health care provider. Document Released: 09/12/2008 Document Revised: 05/11/2016 Document Reviewed: 03/29/2015 Elsevier Interactive Patient Education  [employer]. Medications prescribed today are intended for short period of time and will not be refill upon request, a follow up appointment might  be necessary to discuss continuation of of treatment if appropriate.     In general please monitor for signs of worsening symptoms and seek immediate medical attention if any concerning.  If symptoms are not resolved in a few days/weeks you should schedule a follow up appointment with your doctor, before if needed.  I hope you get better soon!

## 2017-09-17 NOTE — Telephone Encounter (Signed)
Rx changed to Protonix. Protonix susp sent to take 10 ml bid.  Thanks, BJ

## 2017-09-17 NOTE — Progress Notes (Signed)
ACUTE VISIT   HPI:  Chief Complaint  Patient presents with  . trouble swallowing  . Headache    [name] is a 26 y.o. female, who is here today with boyfriend complaining of 4-5 hours of frontal and occipital headache, around 6:30 am today. She denies history of frequent headaches or migraines. She has mild photophobia, denies nausea, vomiting, or focal deficit.  She has not taken anything for headache today. Dull and pressure headache, left frontal and extending to left occipital.  Headache   This is a new problem. The current episode started today. The problem has been unchanged. The pain is located in the occipital and frontal region. The pain is at a severity of 6/10. The pain is moderate. Associated symptoms include coughing, nausea, neck pain, photophobia and vomiting. Pertinent negatives include no back pain, dizziness, ear pain, eye pain, eye redness, facial sweating, fever, insomnia, loss of balance, numbness, phonophobia, rhinorrhea, scalp tenderness, seizures, sinus pressure, sore throat, swollen glands, tingling, weakness or weight loss. Nothing aggravates the symptoms. She has tried nothing for the symptoms. Her past medical history is significant for obesity.  she has not identified exacerbating or alleviating factors.  +Stress, "always." She denies new medications or OTC herbs treatments.   She is also complaining of dysphagia, that this started about a month ago. She has had similar episodes in the past, she was evaluated in acute care and recommended treatment with Nexium 40 mg daily. She has been taking Nexium for a few days now but she has not noted significant improvement. She denies stridor, oral lesions, or sore throat. Nonproductive cough, exacerbated by food intake. She had an episode of vomiting due to cough spell after eating an apple yeaterday  She doesn't have problems with swallowing liquids. She is having trouble swallowing Nexium  tablet.  She denies associated changes in bowel habits, blood in the stool, or melena. Occasionally she had right lower abdominal pain and low back pain, mild. She does not think these are related to above problem.  She has had heartburn and frequent burping. LMP 07/18/17. She is on Nuvaring.  Review of Systems  Constitutional: Positive for activity change, chills and fatigue. Negative for appetite change, fever and weight loss.  HENT: Positive for trouble swallowing. Negative for congestion, ear pain, mouth sores, postnasal drip, rhinorrhea, sinus pressure, sore throat and voice change.   Eyes: Positive for photophobia. Negative for pain and redness.  Respiratory: Positive for cough. Negative for shortness of breath, wheezing and stridor.   Cardiovascular: Negative for chest pain, palpitations and leg swelling.  Gastrointestinal: Positive for nausea and vomiting. Negative for abdominal distention and blood in stool.       Denies changes in bowel habits.  Endocrine: Negative for cold intolerance, heat intolerance, polydipsia, polyphagia and polyuria.  Genitourinary: Negative for decreased urine volume, dysuria and hematuria.  Musculoskeletal: Positive for neck pain. Negative for back pain and gait problem.  Skin: Negative for pallor and rash.  Allergic/Immunologic: Positive for environmental allergies.  Neurological: Positive for headaches. Negative for dizziness, tingling, seizures, syncope, facial asymmetry, weakness, numbness and loss of balance.  Hematological: Negative for adenopathy. Does not bruise/bleed easily.  Psychiatric/Behavioral: Negative for confusion. The patient is nervous/anxious. The patient does not have insomnia.     Current Outpatient Prescriptions on File Prior to Visit  Medication Sig Dispense Refill  . cyclobenzaprine (FLEXERIL) 10 MG tablet Take 1 tablet (10 mg total) by mouth at bedtime. 10 tablet 0  .  mometasone (NASONEX) 50 MCG/ACT nasal spray Place 2 sprays  into the nose daily. 17 g 0  . phentermine 30 MG capsule Take 1 capsule (30 mg total) by mouth every morning. 30 capsule 0   No current facility-administered medications on file prior to visit.      Past Medical History:  Diagnosis Date  . Chicken pox   . Depression   . Hypertension    high blood pressure readings after pregnancy  . S/P thyroid biopsy    No Known Allergies  Social History   Social History  . Marital status: Married    Spouse name: N/A  . Number of children: N/A  . Years of education: N/A   Social History Main Topics  . Smoking status: Never Smoker  . Smokeless tobacco: Never Used  . Alcohol use 0.0 oz/week     Comment: occ.   . Drug use: No  . Sexual activity: Yes    Birth control/ protection: IUD   Other Topics Concern  . 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   Physical Exam  Nursing note and vitals reviewed. Constitutional: She is oriented to person, place, and time. She appears well-developed. No distress.  HENT:  Head: Normocephalic and atraumatic.  Mouth/Throat: Oropharynx is clear and moist and mucous membranes are normal.  Eyes: Pupils are equal, round, and reactive to light. Conjunctivae and EOM are normal.  Fundoscopic exam:      The right eye shows no hemorrhage and no papilledema.       The left eye shows no hemorrhage and no papilledema.  Neck: No tracheal deviation, no edema and no erythema present. No Brudzinski's sign and no Kernig's sign noted. No thyroid mass and no thyromegaly present.  Cardiovascular: Normal rate and regular rhythm.   No murmur heard. Pulses:      Dorsalis pedis pulses are 2+ on the right side, and 2+ on the left side.  Respiratory: Effort normal and breath sounds normal. No respiratory  distress.  GI: Soft. She exhibits no mass. There is no hepatomegaly. There is no tenderness.  Musculoskeletal: She exhibits no edema.       Cervical back: She exhibits tenderness. She exhibits normal range of motion.  Tenderness upon palpation of cervical paraspinal muscles on the left side, trapezium. Pain is exacerbated by cervical range of motion.  Lymphadenopathy:    She has no cervical adenopathy.  Neurological: She is alert and oriented to person, place, and time. She has normal strength. No cranial nerve deficit. Coordination and gait normal.  Reflex Scores:      Bicep reflexes are 2+ on the right side and 2+ on the left side.      Patellar reflexes are 2+ on the right side and 2+ on the left side. Pronator drift negative.  Skin: Skin is warm. No erythema.  Psychiatric: Her mood appears anxious. Her affect is labile. Cognition and memory are normal.  Well groomed, good eye contact.    ASSESSMENT AND PLAN:   Ms. Jasmine Coleman was seen today for trouble swallowing and headache.  Diagnoses and all orders for this visit:  Dysphagia, unspecified type  We  discussed possible etiologies. Given her age and no history of tobacco use she was reassured in regard to a serious illness. She might need swallowing studies and/or EGD if symptoms persist.  -     esomeprazole (NEXIUM) 40 MG packet; Take 40 mg by mouth daily before breakfast.  Gastroesophageal reflux disease, esophagitis presence not specified  Most likely this problem is causing dysphagia. GERD precautions discussed. She is having difficulty swallowing Nexium tablet, so change to packet. Follow-up in 7 days with PCP.  -     esomeprazole (NEXIUM) 40 MG packet; Take 40 mg by mouth daily before breakfast.  Headache, unspecified headache type  We discussed possible causes: Migraine Vs tension like HA. At this time given her normal neurologic exam I don't think brain imaging is needed. After verbal consent and discussion of side  effects she received Toradol 60 mg IM. Further recommendations will be given according to lab results. She was clearly instructed about warning signs. Follow-up with PCP in 7 days.  -     CBC with Differential/Platelet -     Basic metabolic panel -     ketorolac (TORADOL) injection 60 mg; Inject 2 mLs (60 mg total) into the muscle once.  Cervicalgia  Local heat and massage. ROM exercises recommended. I do not think cervical imaging is needed at this time.   Missed menses -     POCT urine pregnancy   Return in about 7 days (around 09/24/2017).   -[name] was advised to seek immediate medical attention if sudden worsening symptoms.     Anahid Eskelson G. [place], MD  [facility] [place] office.

## 2017-09-17 NOTE — Telephone Encounter (Signed)
Monica called from [facility] stating Dr [place] has prescribed a nexium packet and it requires a PA, and they would like to change it due to the patient having a hard time swallowing a capsule. Please advise [phone]

## 2017-09-18 ENCOUNTER — Other Ambulatory Visit: Payer: Self-pay | Admitting: Family Medicine

## 2017-09-18 ENCOUNTER — Encounter: Payer: Self-pay | Admitting: Adult Health

## 2017-09-18 ENCOUNTER — Ambulatory Visit (INDEPENDENT_AMBULATORY_CARE_PROVIDER_SITE_OTHER): Payer: 59 | Admitting: Adult Health

## 2017-09-18 VITALS — BP 130/80 | HR 73 | Temp 98.8°F | Resp 16 | Ht 64.0 in | Wt 219.4 lb

## 2017-09-18 DIAGNOSIS — Z6837 Body mass index (BMI) 37.0-37.9, adult: Secondary | ICD-10-CM | POA: Diagnosis not present

## 2017-09-18 DIAGNOSIS — F329 Major depressive disorder, single episode, unspecified: Secondary | ICD-10-CM | POA: Diagnosis not present

## 2017-09-18 DIAGNOSIS — K219 Gastro-esophageal reflux disease without esophagitis: Secondary | ICD-10-CM

## 2017-09-18 DIAGNOSIS — E6609 Other obesity due to excess calories: Secondary | ICD-10-CM | POA: Diagnosis not present

## 2017-09-18 DIAGNOSIS — F32A Depression, unspecified: Secondary | ICD-10-CM

## 2017-09-18 DIAGNOSIS — Z23 Encounter for immunization: Secondary | ICD-10-CM

## 2017-09-18 DIAGNOSIS — Z0000 Encounter for general adult medical examination without abnormal findings: Secondary | ICD-10-CM

## 2017-09-18 LAB — HEPATIC FUNCTION PANEL
ALK PHOS: 42 U/L (ref 39–117)
ALT: 14 U/L (ref 0–35)
AST: 15 U/L (ref 0–37)
Albumin: 4 g/dL (ref 3.5–5.2)
BILIRUBIN DIRECT: 0.1 mg/dL (ref 0.0–0.3)
BILIRUBIN TOTAL: 0.5 mg/dL (ref 0.2–1.2)
TOTAL PROTEIN: 7.2 g/dL (ref 6.0–8.3)

## 2017-09-18 LAB — LIPID PANEL
CHOL/HDL RATIO: 3
Cholesterol: 175 mg/dL (ref 0–200)
HDL: 64.7 mg/dL (ref >39.00)
LDL Cholesterol: 92 mg/dL (ref 0–99)
NONHDL: 110.17
Triglycerides: 90 mg/dL (ref 0.0–149.0)
VLDL: 18 mg/dL (ref 0.0–40.0)

## 2017-09-18 LAB — TSH: TSH: 2.37 u[IU]/mL (ref 0.35–4.50)

## 2017-09-18 MED ORDER — PANTOPRAZOLE SODIUM 40 MG PO TBEC: 40.0000 mg | DELAYED_RELEASE_TABLET | Freq: Every day | ORAL | 0 refills | Status: DC

## 2017-09-18 MED ORDER — CITALOPRAM HYDROBROMIDE 10 MG PO TABS: 10.0000 mg | ORAL_TABLET | Freq: Every day | ORAL | 3 refills | Status: DC

## 2017-09-18 MED FILL — CITALOPRAM HBR 10 MG TABLET: 10 | 30 days supply | Qty: 30 | Fill #0

## 2017-09-18 MED FILL — PANTOPRAZOLE SOD DR 20 MG T: 20 | 14 days supply | Qty: 28 | Fill #0

## 2017-09-18 NOTE — Progress Notes (Signed)
Subjective:    Patient ID: Jasmine Coleman, female    DOB: October 11, 1991, 26 y.o.   MRN: [id number]  HPI  Patient presents for yearly preventative medicine examination. She is a pleasant 26 year old female who  has a past medical history of Chicken pox; Depression; Hypertension; and S/P thyroid biopsy.  All immunizations and health maintenance protocols were reviewed with the patient and needed orders were placed.  Appropriate screening laboratory values were ordered for the patient including screening of hyperlipidemia, renal function and hepatic function.  Medication reconciliation,  past medical history, social history, problem list and allergies were reviewed in detail with the patient  Goals were established with regard to weight loss, exercise, and  diet in compliance with medications  Wt Readings from Last 3 Encounters:  09/18/17 219 lb 6.4 oz (99.5 kg)  09/17/17 218 lb 6 oz (99.1 kg)  03/21/17 218 lb 1.6 oz (98.9 kg)   Depression - This is a chronic issue. She has been on multiple medications in the past. She has recently started seeing a counselor and has noticed improvement. She denies any SI.   She has a GYN and is up to date on her GYN   She was seen yesterday for an acute issue related to dysphagia. She was prescribed protonix for suspected GERD. She has not started this medication yet.    Review of Systems  Constitutional: Negative.   HENT: Positive for trouble swallowing.   Eyes: Negative.   Respiratory: Negative.   Cardiovascular: Negative.   Gastrointestinal: Negative.   Endocrine: Negative.   Genitourinary: Negative.   Musculoskeletal: Negative.   Skin: Negative.   Neurological: Negative.   Hematological: Negative.   Psychiatric/Behavioral: The patient is nervous/anxious.        Depression    All other systems reviewed and are negative.  Past Medical History:  Diagnosis Date  . Chicken pox   . Depression   . Hypertension    high blood pressure  readings after pregnancy  . S/P thyroid biopsy     Social History   Social History  . Marital status: Married    Spouse name: N/A  . Number of children: N/A  . Years of education: N/A   Occupational History  . Not on file.   Social History Main Topics  . Smoking status: Never Smoker  . Smokeless tobacco: Never Used  . Alcohol use 0.0 oz/week     Comment: occ.   . Drug use: No  . Sexual activity: Yes    Birth control/ protection: IUD   Other Topics Concern  . 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 BIOPSY THYROID    . Wisdome Tooth Extraction      Family History  Problem Relation Age of Onset  . Lung cancer Maternal Grandmother   . Hypertension Maternal Grandmother   . Mental illness Maternal Grandmother        unsure  . Prostate cancer Maternal Grandfather        unsure  . Hypertension Mother   . Mental illness Mother        unsure    No Known Allergies  Current Outpatient Prescriptions on File Prior to Visit  Medication Sig Dispense Refill  . pantoprazole sodium (PROTONIX) 40  mg/20 mL PACK Take 10 mLs (20 mg total) by mouth 2 (two) times daily. Before breakfast and supper. (Patient not taking: Reported on 09/18/2017) 280 mL 0   No current facility-administered medications on file prior to visit.     BP 130/80 (BP Location: Left Arm, Patient Position: Sitting, Cuff Size: Normal)   Pulse 73   Temp 98.8 F (37.1 C) (Oral)   Resp 16   Ht  (1.626 m)   Wt 219 lb 6.4 oz (99.5 kg)   LMP 07/18/2017 (Approximate)   SpO2 99%   BMI 37.66 kg/m       Objective:   Physical Exam  Constitutional: She is oriented to person, place, and time. She appears well-developed and well-nourished. No distress.  Obese   HENT:  Head: Normocephalic and atraumatic.  Right Ear: External ear normal.  Left Ear: External ear  normal.  Nose: Nose normal.  Mouth/Throat: Oropharynx is clear and moist. No oropharyngeal exudate.  Eyes: Pupils are equal, round, and reactive to light. Conjunctivae and EOM are normal. Right eye exhibits no discharge. Left eye exhibits no discharge. No scleral icterus.  Neck: Normal range of motion. Neck supple. No JVD present. No tracheal deviation present. No thyromegaly present.  Cardiovascular: Normal rate, regular rhythm, normal heart sounds and intact distal pulses.  Exam reveals no gallop and no friction rub.   No murmur heard. Pulmonary/Chest: Effort normal and breath sounds normal. No stridor. No respiratory distress. She has no wheezes. She has no rales. She exhibits no tenderness.  Abdominal: Soft. Bowel sounds are normal. She exhibits no distension and no mass. There is no tenderness. There is no rebound and no guarding.  Musculoskeletal: Normal range of motion. She exhibits no edema, tenderness or deformity.  Lymphadenopathy:    She has no cervical adenopathy.  Neurological: She is alert and oriented to person, place, and time. She has normal reflexes. She displays normal reflexes. No cranial nerve deficit. She exhibits normal muscle tone. Coordination normal.  Skin: Skin is warm and dry. No rash noted. She is not diaphoretic. No erythema. No pallor.  Psychiatric: She has a normal mood and affect. Her behavior is normal. Judgment and thought content normal.  Nursing note and vitals reviewed.     Assessment & Plan:  1. Gastroesophageal reflux disease, esophagitis presence not specified - Start protonix  - If no improvement in 2 weeks will send to GI  - do not eat fried foods or fast foods - Do not eat before going to bed  - Avoid spicy foods.   2. Depression, unspecified depression type - Continue to see psychiatry. She is interested in going on medication but would like to start on low dose. I will prescribed Celexa 10 mg. Please follow up in 1 month  - Lipid panel -  TSH - Hepatic function panel  3. Class 2 obesity due to excess calories without serious comorbidity with body mass index (BMI) of 37.0 to 37.9 in adult - Educated on the importance of diet and exercise  - Lipid panel - TSH - Hepatic function panel  4. Routine general medical examination at a health care facility - CBC and BMP were drawn yesterday, both were normal  - Lipid panel - TSH - Hepatic function panel  5. Need for influenza vaccination  - Flu Vaccine QUAD 6+ mos PF IM (Fluarix Quad PF)   Shirline Frees, NP

## 2017-09-19 ENCOUNTER — Telehealth: Payer: Self-pay | Admitting: Adult Health

## 2017-09-19 ENCOUNTER — Encounter: Payer: Self-pay | Admitting: Nurse Practitioner

## 2017-09-19 ENCOUNTER — Other Ambulatory Visit: Payer: Self-pay | Admitting: Adult Health

## 2017-09-19 DIAGNOSIS — R1310 Dysphagia, unspecified: Secondary | ICD-10-CM

## 2017-09-19 NOTE — Telephone Encounter (Signed)
Pt seen on 09/17/17 for GERD by Dr. Swaziland.  Was given a new prescription for pantoprazole (PROTONIX) 40 MG tablet.   Do you need to see pt for dysphagia?

## 2017-09-19 NOTE — Telephone Encounter (Signed)
I will refer her to GI ( order placed). Take pills with plenty of water or foods that you can swallow.

## 2017-09-19 NOTE — Telephone Encounter (Signed)
Pt.notified

## 2017-09-19 NOTE — Telephone Encounter (Signed)
Pt states she has tried taking with applesauce and pudding.  Also mentioned that she has had trouble swallowing medication in the past.  Gets stuck.  Please advise.

## 2017-09-19 NOTE — Telephone Encounter (Signed)
Pt would like to have a call back to discuss going to a GI to discuss the swallowing of the pills have a issue with them getting stuck in the back of her throat.

## 2017-09-19 NOTE — Telephone Encounter (Signed)
I saw her yesterday for her physical.   Can she try taking the pill with applesauce or pudding?

## 2017-09-24 ENCOUNTER — Encounter: Payer: Self-pay | Admitting: Physician Assistant

## 2017-09-24 ENCOUNTER — Ambulatory Visit (INDEPENDENT_AMBULATORY_CARE_PROVIDER_SITE_OTHER): Payer: 59 | Admitting: Physician Assistant

## 2017-09-24 ENCOUNTER — Ambulatory Visit: Payer: 59 | Admitting: Adult Health

## 2017-09-24 VITALS — BP 116/84 | HR 76 | Ht 64.0 in | Wt 214.4 lb

## 2017-09-24 DIAGNOSIS — R1310 Dysphagia, unspecified: Secondary | ICD-10-CM

## 2017-09-24 NOTE — Progress Notes (Signed)
Subjective:    Patient ID: Jasmine Coleman, female    DOB: [birth year], 26 y.o.   MRN: [id number]  HPI Jasmine Coleman is a pleasant 26 year old African-American female, new to GI today referred by Jasmine Coleman N.P. for evaluation of dysphagia. Patient says she had some symptoms in June 2018 with mild dysphagia, she was seen at urgent care and took Prilosec for about a week and her symptoms resolved. She says she started having recurrent symptoms about a month ago which have progressed. She says she'Coleman having a lot of burping and belching but denies any heartburn or indigestion type feeling. She is able to swallow liquids without difficulty but solid food and pills feel as if they are sitting in her upper esophagus. She has a fullness sensation like there is "something there ",all the time. No odynophagia. She has been eating very soft foods yogurt etc. and has lost about 10 pounds over the past 3 weeks. She was started on Protonix which was increased to twice daily, and has not helped her symptoms at all. Family history negative for GI diseases far she is aware. No regular aspirin or NSAIDs.  Labs from 09/17/2017 reviewed- unremarkable CBC ,CMET ,and TSH  Review of Systems Pertinent positive and negative review of systems were noted in the above HPI section.  All other review of systems was otherwise negative.  Outpatient Encounter Prescriptions as of 09/24/2017  Medication Sig  . citalopram (CELEXA) 10 MG tablet Take 1 tablet (10 mg total) by mouth daily.  . Multiple Vitamins-Minerals (MULTIVITAMIN ADULT PO) Take 1 capsule by mouth daily.  . pantoprazole (PROTONIX) 40 MG tablet Take 1 tablet (40 mg total) by mouth daily. (Patient taking differently: Take 40 mg by mouth 2 (two) times daily. )   No facility-administered encounter medications on file as of 09/24/2017.    No Known Allergies Patient Active Problem List   Diagnosis Date Noted  . GERD (gastroesophageal reflux disease) 09/17/2017  .  Obesity 07/18/2016  . Depression 08/27/2015   Social History   Social History  . Marital status: Married    Spouse name: N/A  . Number of children: N/A  . Years of education: N/A   Occupational History  . Not on file.   Social History Main Topics  . Smoking status: Never Smoker  . Smokeless tobacco: Never Used  . Alcohol use 0.0 oz/week     Comment: occ.   . Drug use: No  . Sexual activity: Yes    Birth control/ protection: IUD   Other Topics Concern  . 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'Coleman family history includes Hypertension in her maternal grandmother and mother; Lung cancer in her maternal grandmother; Mental illness in her maternal grandmother and mother; Prostate cancer in her maternal grandfather.      Objective:    Vitals:   09/24/17 1328  BP: 116/84  Pulse: 76    Physical Exam  well-developed young  female in no acute distress, Pleasant blood pressure 116/84, pulse 76, height 5 foot 4, weight 214, BMI 36.8. HEENT ;nontraumatic normocephalic EOMI PERRLA sclera anicteric, Neck;supple, no JVD, nontender no palpable thyromegaly, Cardiovascular; regular rate and rhythm with S1-S2 no murmur rub or gallop, Pulmonary ;clear bilaterally, Abdomen;, obese, soft nontender nondistended bowel sounds are active there is no palpable mass  or hepatosplenomegaly, Rectal ;exam not done, Ext;no clubbing cyanosis or edema skin warm dry, Neuropsych ;mood and affect appropriate     Assessment & Plan:   #22 26 year old female with 1 month history of persistent solid food and liquid dysphagia with sensation of fullness in the upper esophagus, increased belching and weight loss No response to twice a day PPI Rule out underlying motility disorder i.e. achalasia, rule out possible eosinophilic esophagitis. #2 obesity #3 history of depression  Plan; Continue twice a day Protonix 2  more weeks then decrease to once daily Schedule for barium swallow with a tablet later this week. We will go ahead and schedule for EGD with Dr. Russella Coleman. Procedure discussed in detail with patient including risks and benefits and she is agreeable to proceed. Advised to continue full liquid to very soft diet.  Jasmine Coleman Jasmine Cancel PA-C 09/24/2017   Cc: Jasmine Frees, NP

## 2017-09-24 NOTE — Patient Instructions (Signed)
ContinueTwice daily Protonix 40 mg for 2 more weeks then go to once daily. Full liquid diet/very soft diet.    You have been scheduled for a Barium Esophogram at [facility]  On Thursday 09-27-2017 at 1:00 PM . Please arrive at 12:45 PM15  to your appointment for registration. Make certain not to have anything to eat or drink 4 hours prior to your test. If you need to reschedule for any reason, please contact radiology at [phone] to do so.  Go to the [employer] at [facility]. There is valet parking.  __________________________________________________________________ A barium swallow is an examination that concentrates on views of the esophagus. This tends to be a double contrast exam (barium and two liquids which, when combined, create a gas to distend the wall of the oesophagus) or single contrast (non-ionic iodine based). The study is usually tailored to your symptoms so a good history is essential. Attention is paid during the study to the form, structure and configuration of the esophagus, looking for functional disorders (such as aspiration, dysphagia, achalasia, motility and reflux) EXAMINATION You may be asked to change into a gown, depending on the type of swallow being performed. A radiologist and radiographer will perform the procedure. The radiologist will advise you of the type of contrast selected for your procedure and direct you during the exam. You will be asked to stand, sit or lie in several different positions and to hold a small amount of fluid in your mouth before being asked to swallow while the imaging is performed .In some instances you may be asked to swallow barium coated marshmallows to assess the motility of a solid food bolus. The exam can be recorded as a digital or video fluoroscopy procedure. POST PROCEDURE It will take 1-2 days for the barium to pass through your system. To facilitate this, it is important, unless otherwise directed, to increase  your fluids for the next 24-48hrs and to resume your normal diet.  This test typically takes about 30 minutes to perform. __________________________________________________________________________________  Bonita Quin have been scheduled for an endoscopy. Please follow written instructions given to you at your visit today. If you use inhalers (even only as needed), please bring them with you on the day of your procedure. Your physician has requested that you go to www.startemmi.com and enter the access code given to you at your visit today. This web site gives a general overview about your procedure. However, you should still follow specific instructions given to you by our office regarding your preparation for the procedure.

## 2017-09-24 NOTE — Progress Notes (Signed)
Reviewed and agree with management plan.  Stanely Sexson T. Ceonna Frazzini, MD FACG 

## 2017-09-27 ENCOUNTER — Ambulatory Visit (HOSPITAL_COMMUNITY)
Admission: RE | Admit: 2017-09-27 | Discharge: 2017-09-27 | Disposition: A | Discharge status: Home / Self Care | Payer: 59 | Source: Ambulatory Visit | Attending: Physician Assistant | Admitting: Physician Assistant

## 2017-09-27 DIAGNOSIS — R05 Cough: Secondary | ICD-10-CM | POA: Diagnosis not present

## 2017-09-27 DIAGNOSIS — R1310 Dysphagia, unspecified: Secondary | ICD-10-CM | POA: Diagnosis not present

## 2017-10-01 ENCOUNTER — Ambulatory Visit: Payer: 59 | Admitting: Nurse Practitioner

## 2017-10-02 ENCOUNTER — Encounter: Payer: Self-pay | Admitting: Physician Assistant

## 2017-10-04 ENCOUNTER — Encounter: Payer: Self-pay | Admitting: Gastroenterology

## 2017-10-17 ENCOUNTER — Encounter: Payer: 59 | Admitting: Gastroenterology

## 2017-11-23 MED FILL — NUVARING VAGINAL RING: 0.12-0.015 | 84 days supply | Qty: 3 | Fill #1

## 2017-12-06 DIAGNOSIS — H5213 Myopia, bilateral: Secondary | ICD-10-CM | POA: Diagnosis not present

## 2018-01-08 ENCOUNTER — Other Ambulatory Visit: Payer: Self-pay | Admitting: Adult Health

## 2018-01-08 NOTE — Telephone Encounter (Signed)
GI wanted her to decrease to once a day back on October

## 2018-01-08 NOTE — Telephone Encounter (Signed)
Jasmine Coleman - Please clarify rx  Sig: Take 1 tablet (40 mg total) by mouth daily.  Patient taking differently: Take 40 mg by mouth 2 (two) times daily.         Thanks!

## 2018-01-09 MED ORDER — PANTOPRAZOLE SODIUM 40 MG PO TBEC: 40.0000 mg | DELAYED_RELEASE_TABLET | Freq: Every day | ORAL | 1 refills | Status: AC

## 2018-01-10 MED FILL — PANTOPRAZOLE SOD DR 40 MG T: 40 | 90 days supply | Qty: 90 | Fill #0

## 2018-02-15 MED FILL — NUVARING VAGINAL RING: 0.12-0.015 | 84 days supply | Qty: 3 | Fill #2

## 2018-02-18 ENCOUNTER — Ambulatory Visit (INDEPENDENT_AMBULATORY_CARE_PROVIDER_SITE_OTHER)
Admission: RE | Admit: 2018-02-18 | Discharge: 2018-02-18 | Disposition: A | Discharge status: Home / Self Care | Payer: No Typology Code available for payment source | Source: Ambulatory Visit | Attending: Family Medicine | Admitting: Family Medicine

## 2018-02-18 ENCOUNTER — Encounter: Payer: Self-pay | Admitting: Family Medicine

## 2018-02-18 ENCOUNTER — Ambulatory Visit (INDEPENDENT_AMBULATORY_CARE_PROVIDER_SITE_OTHER): Payer: No Typology Code available for payment source | Admitting: Family Medicine

## 2018-02-18 VITALS — BP 126/72 | HR 85 | Temp 98.4°F | Resp 12 | Ht 64.0 in | Wt 222.2 lb

## 2018-02-18 DIAGNOSIS — M25522 Pain in left elbow: Secondary | ICD-10-CM

## 2018-02-18 DIAGNOSIS — R0602 Shortness of breath: Secondary | ICD-10-CM

## 2018-02-18 DIAGNOSIS — R079 Chest pain, unspecified: Secondary | ICD-10-CM | POA: Diagnosis not present

## 2018-02-18 DIAGNOSIS — R05 Cough: Secondary | ICD-10-CM

## 2018-02-18 DIAGNOSIS — J309 Allergic rhinitis, unspecified: Secondary | ICD-10-CM

## 2018-02-18 DIAGNOSIS — R053 Chronic cough: Secondary | ICD-10-CM

## 2018-02-18 MED ORDER — FLUTICASONE PROPIONATE 50 MCG/ACT NA SUSP: 1.0000 | Freq: Two times a day (BID) | NASAL | 3 refills | Status: AC

## 2018-02-18 NOTE — Progress Notes (Signed)
ACUTE VISIT   HPI:  Chief Complaint  Patient presents with  . Chest discomfort    sx started Thursday, off and on, lasts for a few seconds  . Shortness of Breath  . Arm Pain    [name] is a 27 y.o. female, who is here today complaining of 3-4 days of ches pain. "Little poking" sharp like pain, "very uncomfortable."  Pain is localized under left clavicle, it is not radiated but a few minutes after pain resolves she feels left elbow pain.  Also left interscapular achy pain. She denies numbness, tingling, or burning sensation.  Pain last about 30 seconds.  She has not identified exacerbating or alleviating factors. It is not associated with dyspnea, diaphoresis, or dizziness.  She denies any recent trauma or unusual activity that could have injured area.  She is also complaining of shortness of breath, intermittently for the past 2-3 days.  This is not related to chest discomfort. Is exacerbated by walking up stairs and alleviated by rest. This is "not bad." She denies dyspnea with other activities. No associated cough but she mentions that she has had intermittent cough for over a year. Productive cough with thick white sputum, she has not identified exacerbating or alleviating factors. No associated wheezing.  She has not noted skin rash, localized edema/erythema, or lower extremity edema.  Occasionally she has episodes of palpitation, which she has had for years and attributes it to stress and anxiety. Currently she is on Celexa 10 mg daily, which she does not take regularly.   She has Hx of GERD and currently she is on Protonix 40 mg daily.  Denies abdominal pain, nausea, vomiting, changes in bowel habits, or melena.   Reports intermittent palpitations when she is under stress and related to anxiety for a while.   Review of Systems  Constitutional: Negative for activity change, appetite change, fatigue, fever and unexpected weight change.    HENT: Positive for congestion, postnasal drip and rhinorrhea. Negative for mouth sores, nosebleeds, sore throat, trouble swallowing and voice change.   Eyes: Negative for redness and visual disturbance.  Respiratory: Positive for cough and shortness of breath. Negative for wheezing.   Cardiovascular: Negative for palpitations and leg swelling.  Gastrointestinal: Negative for abdominal pain, nausea and vomiting.       Negative for changes in bowel habits.  Genitourinary: Negative for decreased urine volume, difficulty urinating, dysuria and hematuria.  Musculoskeletal: Positive for arthralgias and back pain. Negative for gait problem and joint swelling.  Skin: Negative for rash and wound.  Neurological: Negative for syncope, weakness, numbness and headaches.  Psychiatric/Behavioral: Negative for confusion. The patient is nervous/anxious.       Current Outpatient Medications on File Prior to Visit  Medication Sig Dispense Refill  . citalopram (CELEXA) 10 MG tablet Take 1 tablet (10 mg total) by mouth daily. (Patient taking differently: Take 10 mg by mouth daily. Patient not taking like she should on a regular basis) 30 tablet 3  . Multiple Vitamins-Minerals (MULTIVITAMIN ADULT PO) Take 1 capsule by mouth daily.    Marland Kitchen NUVARING 0.12-0.015 MG/24HR vaginal ring   4  . pantoprazole (PROTONIX) 40 MG tablet Take 1 tablet (40 mg total) by mouth daily. 90 tablet 1   No current facility-administered medications on file prior to visit.      Past Medical History:  Diagnosis Date  . Chicken pox   . Depression   . Hypertension    high blood pressure  readings after pregnancy  . S/P thyroid biopsy    No Known Allergies  Social History   Socioeconomic History  . Marital status: Married    Spouse name: None  . Number of children: None  . Years of education: None  . Highest education level: None  Social Needs  . Financial resource strain: None  . Food insecurity - worry: None  . Food  insecurity - inability: None  . Transportation needs - medical: None  . Transportation needs - non-medical: None  Occupational History  . None  Tobacco Use  . Smoking status: Never Smoker  . Smokeless tobacco: Never Used  Substance and Sexual Activity  . Alcohol use: Yes    Alcohol/week: 0.0 oz    Comment: occ.   . Drug use: No  . Sexual activity: Yes    Birth control/protection: IUD  Other Topics Concern  . 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   Physical Exam  Nursing note and vitals reviewed. Constitutional: She is oriented to person, place, and time. She appears well-developed. No distress.  HENT:  Head: Atraumatic.  Mouth/Throat: Oropharynx is clear and moist and mucous membranes are normal.  Hypertrophic turbinates.   Postnasal drainage.   Eyes: Conjunctivae are normal. Pupils are equal, round, and reactive to light.  Cardiovascular: Normal rate and regular rhythm.  No murmur heard. Pulses:      Dorsalis pedis pulses are 2+ on the right side, and 2+ on the left side.  Respiratory: Effort normal and breath sounds normal. No respiratory distress. She exhibits no tenderness.  GI: Soft. She exhibits no mass. There is no hepatomegaly. There is no tenderness.  Musculoskeletal: She exhibits no edema or tenderness.       Left shoulder: She exhibits normal range of motion and no tenderness.       Left elbow: She exhibits normal range of motion, no swelling and no deformity. No tenderness found.       Cervical back: She exhibits normal range of motion, no tenderness and no bony tenderness.       Thoracic back: She exhibits no tenderness.  Lymphadenopathy:    She has no cervical adenopathy.  Neurological: She is alert and oriented to person, place, and time. She has  normal strength. Coordination normal.  Skin: Skin is warm. No rash noted. No erythema.  Psychiatric: Her mood appears anxious.  Well groomed, good eye contact.    ASSESSMENT AND PLAN:   Ms. Jasmine Coleman was seen today for chest discomfort, shortness of breath and arm pain.  Diagnoses and all orders for this visit:  Chest pain, unspecified type  Possible etiologies discussed, seems to be musculoskeletal. ? GERD. Other symptoms she is reporting do not seem related to chest pain. Instructed about warning signs.  -     DG Chest 2 View; Future  SOB (shortness of breath)  No respiratory distress. ? Obesity,deconditioning,asthma,anxiety, and allergies among some.  Further recommendations will be given according to imaging results.  Elbow pain, left  Examination today negative. I do not think it is related with chest pain. F/U as needed.  Cough, persistent  Chronic. ? Allergies,asthma,GERD among some.  -  DG Chest 2 View; Future  Allergic rhinitis, unspecified seasonality, unspecified trigger  Saline nasal irrigations as needed. Flonase nasal spray daily as needed and daily Zyrtec 10 mgm daily may help.   -     fluticasone (FLONASE) 50 MCG/ACT nasal spray; Place 1 spray into both nostrils 2 (two) times daily.      Return in about 3 weeks (around 03/11/2018) for Chest pain,SOB.     -[name] was advised to seek immediate medical attention if sudden worsening symptoms.       Len Kluver G. [place]il[place], MD  [facility]. [place] office.

## 2018-02-18 NOTE — Patient Instructions (Addendum)
  [name] I have seen you today for an acute visit.  A few things to remember from today's visit:   Chest pain, unspecified type - Plan: DG Chest 2 View  SOB (shortness of breath)  Elbow pain, left  Cough, persistent  Allergic rhinitis, unspecified seasonality, unspecified trigger - Plan: fluticasone (FLONASE) 50 MCG/ACT nasal spray   Today X ray was ordered.  This [facility] done at [facility] between 8 am and 5 pm: [address] [phone].   Over the counter Zyrtec 10 mg daily.  In general please monitor for signs of worsening symptoms and seek immediate medical attention if any concerning.    I hope you get better soon!

## 2018-04-25 ENCOUNTER — Ambulatory Visit (INDEPENDENT_AMBULATORY_CARE_PROVIDER_SITE_OTHER): Payer: No Typology Code available for payment source | Admitting: Adult Health

## 2018-04-25 ENCOUNTER — Encounter: Payer: Self-pay | Admitting: Adult Health

## 2018-04-25 VITALS — BP 130/80 | Temp 98.4°F | Wt 217.0 lb

## 2018-04-25 DIAGNOSIS — F419 Anxiety disorder, unspecified: Secondary | ICD-10-CM | POA: Diagnosis not present

## 2018-04-25 DIAGNOSIS — F329 Major depressive disorder, single episode, unspecified: Secondary | ICD-10-CM

## 2018-04-25 MED ORDER — TRAZODONE HCL 50 MG PO TABS: 25.0000 mg | ORAL_TABLET | Freq: Every evening | ORAL | 1 refills | Status: AC | PRN

## 2018-04-25 MED FILL — traZODone HCL 50 MG TABS: 50 | 30 days supply | Qty: 30 | Fill #0

## 2018-04-25 NOTE — Progress Notes (Signed)
Subjective:    Patient ID: Jasmine Coleman, female    DOB: June 17, 1991, 27 y.o.   MRN: [id number]  HPI 27 year old female who  has a past medical history of Chicken pox, Depression, Hypertension, and S/P thyroid biopsy. She presents to the office today to discuss mental health options. She has been placed on Celexa in the past for depression but stopped this one month ago due to not feeling as though it was working well. She has multiple stressors at home and work that is causing anxiety and depression.  She also reports that she has not been sleeping well due to anxiety. She denies any SI.   She continues to do counseling which she finds helpful. She would like to try another medication   Review of Systems See HPI   Past Medical History:  Diagnosis Date  . Chicken pox   . Depression   . Hypertension    high blood pressure readings after pregnancy  . S/P thyroid biopsy     Social History   Socioeconomic History  . Marital status: Married    Spouse name: Not on file  . Number of children: Not on file  . Years of education: Not on file  . Highest education level: Not on file  Occupational History  . Not on file  Social Needs  . Financial resource strain: Not on file  . Food insecurity:    Worry: Not on file    Inability: Not on file  . Transportation needs:    Medical: Not on file    Non-medical: Not on file  Tobacco Use  . Smoking status: Never Smoker  . Smokeless tobacco: Never Used  Substance and Sexual Activity  . Alcohol use: Yes    Alcohol/week: 0.0 oz    Comment: occ.   . Drug use: No  . Sexual activity: Yes    Birth control/protection: IUD  Lifestyle  . Physical activity:    Days per week: Not on file    Minutes per session: Not on file  . Stress: Not on file  Relationships  . Social connections:    Talks on phone: Not on file    Gets together: Not on file    Attends religious service: Not on file    Active member of club or organization: Not on  file    Attends meetings of clubs or organizations: Not on file    Relationship status: Not on file  . Intimate partner violence:    Fear of current or ex partner: Not on file    Emotionally abused: Not on file    Physically abused: Not on file    Forced sexual activity: Not on file  Other Topics Concern  . 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 BIOPSY THYROID    . Wisdome Tooth Extraction      Family History  Problem Relation Age of Onset  . Lung cancer Maternal Grandmother   . Hypertension Maternal Grandmother   . Mental illness Maternal Grandmother        unsure  . Prostate cancer Maternal Grandfather        unsure  . Hypertension Mother   . Mental illness Mother        unsure    No Known  Allergies  Current Outpatient Medications on File Prior to Visit  Medication Sig Dispense Refill  . fluticasone (FLONASE) 50 MCG/ACT nasal spray Place 1 spray into both nostrils 2 (two) times daily. 16 g 3  . Multiple Vitamins-Minerals (MULTIVITAMIN ADULT PO) Take 1 capsule by mouth daily.    Marland Kitchen NUVARING 0.12-0.015 MG/24HR vaginal ring   4  . pantoprazole (PROTONIX) 40 MG tablet Take 1 tablet (40 mg total) by mouth daily. 90 tablet 1   No current facility-administered medications on file prior to visit.     BP 130/80   Temp 98.4 F (36.9 C) (Oral)   Wt 217 lb (98.4 kg)   BMI 37.25 kg/m       Objective:   Physical Exam  Constitutional: She appears well-developed and well-nourished. No distress.  Cardiovascular: Normal rate, regular rhythm, normal heart sounds and intact distal pulses. Exam reveals no gallop and no friction rub.  No murmur heard. Pulmonary/Chest: Effort normal and breath sounds normal. No stridor. No respiratory distress. She has no wheezes. She has no rales. She exhibits no tenderness.  Skin: Skin is warm and dry.  Capillary refill takes less than 2 seconds. She is not diaphoretic.  Psychiatric: She has a normal mood and affect. Her behavior is normal. Judgment and thought content normal.  Nursing note and vitals reviewed.     Assessment & Plan:  1. Anxiety and depression - traZODone (DESYREL) 50 MG tablet; Take 0.5-1 tablets (25-50 mg total) by mouth at bedtime as needed for sleep.  Dispense: 30 tablet; Refill: 1 - Follow up in one month  - If any thoughts of SI she is to go to the ER   Shirline Frees, NP

## 2018-05-10 MED FILL — NUVARING VAGINAL RING: 0.12-0.015 | 84 days supply | Qty: 3 | Fill #3

## 2018-05-28 ENCOUNTER — Ambulatory Visit (INDEPENDENT_AMBULATORY_CARE_PROVIDER_SITE_OTHER): Payer: No Typology Code available for payment source | Admitting: Adult Health

## 2018-05-28 ENCOUNTER — Encounter: Payer: Self-pay | Admitting: Adult Health

## 2018-05-28 VITALS — BP 128/84 | Temp 98.3°F | Wt 220.0 lb

## 2018-05-28 DIAGNOSIS — F419 Anxiety disorder, unspecified: Secondary | ICD-10-CM

## 2018-05-28 DIAGNOSIS — F329 Major depressive disorder, single episode, unspecified: Secondary | ICD-10-CM | POA: Diagnosis not present

## 2018-05-28 DIAGNOSIS — F5104 Psychophysiologic insomnia: Secondary | ICD-10-CM

## 2018-05-28 NOTE — Progress Notes (Signed)
Subjective:    Patient ID: Jasmine Coleman, female    DOB: [birth year], 27 y.o.   MRN: [id number]  HPI  27 year old female who  has a past medical history of Chicken pox, Depression, Hypertension, and S/P thyroid biopsy. She presents to the office today for one month follow up regarding anxiety, depression, insomnia.  During her last office visit she reported that she stopped Celexa approximately 1 month prior and she thought it was working well.  She had multiple stressors at home and work that were causing anxiety and depression and she was not able to sleep.  He denied any SI.  She continued to do counseling which she found to be very helpful.  Subsequently she was started on trazodone for her mental health issues.  Today in the office she reports that she has noticed that when she takes 50 mg for sleep issues, that she gets better sleep but feels " groggy" in the morning. When she takes 25 mg she does not feel as though she gets as good of sleep but does feel as though it does not make her as sleepy in the morning. She has not taken the medication in the last week or so.   She denies any improvement in depression or anxiety.    Review of Systems  Constitutional: Negative.   Respiratory: Negative.   Cardiovascular: Negative.   Gastrointestinal: Negative.   Endocrine: Negative.   Skin: Negative.   Hematological: Negative.   Psychiatric/Behavioral: Positive for sleep disturbance. Negative for decreased concentration, self-injury and suicidal ideas. The patient is nervous/anxious.    Past Medical History:  Diagnosis Date  . Chicken pox   . Depression   . Hypertension    high blood pressure readings after pregnancy  . S/P thyroid biopsy     Social History   Socioeconomic History  . Marital status: Married    Spouse name: Not on file  . Number of children: Not on file  . Years of education: Not on file  . Highest education level: Not on file  Occupational History  . Not on  file  Social Needs  . Financial resource strain: Not on file  . Food insecurity:    Worry: Not on file    Inability: Not on file  . Transportation needs:    Medical: Not on file    Non-medical: Not on file  Tobacco Use  . Smoking status: Never Smoker  . Smokeless tobacco: Never Used  Substance and Sexual Activity  . Alcohol use: Yes    Alcohol/week: 0.0 oz    Comment: occ.   . Drug use: No  . Sexual activity: Yes    Birth control/protection: IUD  Lifestyle  . Physical activity:    Days per week: Not on file    Minutes per session: Not on file  . Stress: Not on file  Relationships  . Social connections:    Talks on phone: Not on file    Gets together: Not on file    Attends religious service: Not on file    Active member of club or organization: Not on file    Attends meetings of clubs or organizations: Not on file    Relationship status: Not on file  . Intimate partner violence:    Fear of current or ex partner: Not on file    Emotionally abused: Not on file    Physically abused: Not on file    Forced sexual activity: Not on file  Other Topics Concern  . 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 BIOPSY THYROID    . Wisdome Tooth Extraction      Family History  Problem Relation Age of Onset  . Lung cancer Maternal Grandmother   . Hypertension Maternal Grandmother   . Mental illness Maternal Grandmother        unsure  . Prostate cancer Maternal Grandfather        unsure  . Hypertension Mother   . Mental illness Mother        unsure    No Known Allergies  Current Outpatient Medications on File Prior to Visit  Medication Sig Dispense Refill  . fluticasone (FLONASE) 50 MCG/ACT nasal spray Place 1 spray into both nostrils 2 (two) times daily. 16 g 3  . Multiple Vitamins-Minerals (MULTIVITAMIN ADULT PO) Take 1 capsule by mouth  daily.    Marland Kitchen NUVARING 0.12-0.015 MG/24HR vaginal ring   4  . pantoprazole (PROTONIX) 40 MG tablet Take 1 tablet (40 mg total) by mouth daily. 90 tablet 1  . traZODone (DESYREL) 50 MG tablet Take 0.5-1 tablets (25-50 mg total) by mouth at bedtime as needed for sleep. 30 tablet 1   No current facility-administered medications on file prior to visit.     BP 128/84   Temp 98.3 F (36.8 C) (Oral)   Wt 220 lb (99.8 kg)   BMI 37.76 kg/m       Objective:   Physical Exam  Constitutional: She is oriented to person, place, and time. She appears well-developed and well-nourished. No distress.  HENT:  Mouth/Throat: No oropharyngeal exudate.  Cardiovascular: Normal rate, regular rhythm, normal heart sounds and intact distal pulses. Exam reveals no gallop and no friction rub.  No murmur heard. Pulmonary/Chest: Effort normal and breath sounds normal. No stridor. No respiratory distress. She has no wheezes. She has no rales. She exhibits no tenderness.  Musculoskeletal: Normal range of motion.  Neurological: She is alert and oriented to person, place, and time. No cranial nerve deficit. Coordination normal.  Skin: Skin is warm and dry. Capillary refill takes less than 2 seconds. No rash noted. No erythema. No pallor.  Psychiatric: She has a normal mood and affect. Her behavior is normal. Judgment and thought content normal.  Nursing note and vitals reviewed.     Assessment & Plan:  - Will have her take 25 mg on a nightly basis. If no improvement in symptoms, will add Wellbutrin. I am ok with her sending me a mychart message in the next month to see how she is doing   Shirline Frees, NP

## 2018-08-23 MED FILL — PANTOPRAZOLE SOD DR 40 MG T: 40 | 90 days supply | Qty: 90 | Fill #1

## 2018-08-23 MED FILL — traZODone HCL 50 MG TABS: 50 | 30 days supply | Qty: 30 | Fill #1

## 2018-08-23 MED FILL — FLUTICASONE PROP 50 MCG SPR: 50 | 30 days supply | Qty: 16 | Fill #0

## 2019-01-03 MED FILL — metroNIDAZOLE 500 MG TABS: 500 | 7 days supply | Qty: 14 | Fill #0

## 2019-01-20 MED FILL — NITROFURANTOIN MONO-MCR 100: 100 | 7 days supply | Qty: 14 | Fill #0

## 2019-06-02 ENCOUNTER — Emergency Department (INDEPENDENT_AMBULATORY_CARE_PROVIDER_SITE_OTHER): Payer: No Typology Code available for payment source

## 2019-06-02 ENCOUNTER — Encounter: Payer: Self-pay | Admitting: Emergency Medicine

## 2019-06-02 ENCOUNTER — Other Ambulatory Visit: Payer: Self-pay

## 2019-06-02 ENCOUNTER — Emergency Department
Admission: EM | Admit: 2019-06-02 | Discharge: 2019-06-02 | Disposition: A | Discharge status: Home / Self Care | Payer: No Typology Code available for payment source | Source: Home / Self Care | Attending: Family Medicine | Admitting: Family Medicine

## 2019-06-02 DIAGNOSIS — S0083XA Contusion of other part of head, initial encounter: Secondary | ICD-10-CM | POA: Diagnosis not present

## 2019-06-02 DIAGNOSIS — R6884 Jaw pain: Secondary | ICD-10-CM | POA: Diagnosis not present

## 2019-06-02 DIAGNOSIS — R202 Paresthesia of skin: Secondary | ICD-10-CM

## 2019-06-02 NOTE — ED Provider Notes (Signed)
[facility]    CSN: [id number] Arrival date & time: 06/02/19  0851     History   Chief Complaint Chief Complaint  Patient presents with  . Facial Injury    HPI Jasmine Coleman is a 28 y.o. female.   Patient states that yesterday she hit a tree with a metal baseball bat, and it bounced back, hitting her on her chin.  Since then she has had swelling, mild pain, and loss of sensation in her chin extending to her left angle of the jaw.  She denies trismaus.  She denies bleeding or lacerations.     Past Medical History:  Diagnosis Date  . Chicken pox   . Depression   . Hypertension    high blood pressure readings after pregnancy  . S/P thyroid biopsy     Patient Active Problem List   Diagnosis Date Noted  . GERD (gastroesophageal reflux disease) 09/17/2017  . Obesity 07/18/2016  . Depression 08/27/2015    Past Surgical History:  Procedure Laterality Date  . BIOPSY THYROID    . Wisdome Tooth Extraction      OB History   No obstetric history on file.      Home Medications    Prior to Admission medications   Medication Sig Start Date End Date Taking? Authorizing Provider  fluticasone (FLONASE) 50 MCG/ACT nasal spray Place 1 spray into both nostrils 2 (two) times daily. 02/18/18   [place], Betty G, MD  Multiple Vitamins-Minerals (MULTIVITAMIN ADULT PO) Take 1 capsule by mouth daily.    [provider]  Lavella LemonsNUVARING 0.12-0.015 MG/24HR vaginal ring  02/15/18   [provider]  pantoprazole (PROTONIX) 40 MG tablet Take 1 tablet (40 mg total) by mouth daily. 01/09/18   Nafziger, Kandee Keenory, NP  traZODone (DESYREL) 50 MG tablet Take 0.5-1 tablets (25-50 mg total) by mouth at bedtime as needed for sleep. 04/25/18   Shirline FreesNafziger, Cory, NP    Family History Family History  Problem Relation Age of Onset  . Lung cancer Maternal Grandmother   . Hypertension Maternal Grandmother   . Mental illness Maternal Grandmother        unsure  . Prostate cancer  Maternal Grandfather        unsure  . Hypertension Mother   . Mental illness Mother        unsure    Social History Social History   Tobacco Use  . Smoking status: Never Smoker  . Smokeless tobacco: Never Used  Substance Use Topics  . Alcohol use: Yes    Alcohol/week: 0.0 standard drinks    Comment: occ.   . Drug use: No     Allergies   Patient has no known allergies.   Review of Systems Review of Systems  Constitutional: Negative for activity change, chills, diaphoresis, fatigue and fever.  HENT: Positive for facial swelling and mouth sores. Negative for congestion, dental problem, ear discharge, ear pain, hearing loss, sinus pain, tinnitus and trouble swallowing.   Eyes: Negative.   Respiratory: Negative.   Gastrointestinal: Negative.   Musculoskeletal: Negative.   Skin: Negative.   Neurological: Positive for numbness. Negative for facial asymmetry and headaches.     Physical Exam Triage Vital Signs ED Triage Vitals  Enc Vitals Group     BP 06/02/19 1047 (!) 153/100     Pulse Rate 06/02/19 1047 67     Resp --      Temp 06/02/19 1047 98.7 F (37.1 C)     Temp Source  06/02/19 1047 Oral     SpO2 06/02/19 1047 100 %     Weight 06/02/19 1049 195 lb (88.5 kg)     Height 06/02/19 1049 5\' 4"  (1.626 m)     Head Circumference --      Peak Flow --      Pain Score 06/02/19 1048 5     Pain Loc --      Pain Edu? --      Excl. in [place]? --    No data found.  Updated Vital Signs BP (!) 153/100 (BP Location: Right Arm)   Pulse 67   Temp 98.7 F (37.1 C) (Oral)   Ht 5\' 4"  (1.626 m)   Wt 88.5 kg   LMP 05/21/2019   SpO2 100%   BMI 33.47 kg/m   Visual Acuity Right Eye Distance:   Left Eye Distance:   Bilateral Distance:    Right Eye Near:   Left Eye Near:    Bilateral Near:     Physical Exam Vitals signs and nursing note reviewed.  Constitutional:      General: She is not in acute distress.    Appearance: Normal appearance. She is not toxic-appearing.   HENT:     Head:      Comments: Chin has mild swelling with minimal tenderness to palpation.  There is decreased sensation in her chin and left manible area.    Right Ear: Tympanic membrane, ear canal and external ear normal.     Left Ear: Tympanic membrane, ear canal and external ear normal.     Nose: Nose normal.     Mouth/Throat:     Mouth: Mucous membranes are moist.     Dentition: No dental tenderness or gum lesions.     Pharynx: Oropharynx is clear.      Comments: She has a superficial abrasion to the inner surface of her left lower lip.  Teeth are intact, and gingiva have no tenderness to palpation. No difficulty opening jaw. Eyes:     Conjunctiva/sclera: Conjunctivae normal.     Pupils: Pupils are equal, round, and reactive to light.  Neck:     Musculoskeletal: Normal range of motion. No muscular tenderness.  Cardiovascular:     Rate and Rhythm: Normal rate.  Pulmonary:     Effort: Pulmonary effort is normal.  Lymphadenopathy:     Cervical: No cervical adenopathy.  Skin:    General: Skin is warm and dry.     Findings: No rash.  Neurological:     Mental Status: She is alert.      UC Treatments / Results  Labs (all labs ordered are listed, but only abnormal results are displayed) Labs Reviewed - No data to display  EKG None  Radiology Dg Mandible 4 Views  Result Date: 06/02/2019 CLINICAL DATA:  Left jaw pain and swelling following injury yesterday. EXAM: MANDIBLE - 4+ VIEW COMPARISON:  Cervical spine radiographs 03/21/2017. FINDINGS: There is no evidence of acute fracture or dislocation. The temporomandibular joints appear intact. Patient has a nasal ring. No other foreign bodies or focal soft tissue swelling identified. IMPRESSION: No acute osseous findings. Electronically Signed   By: Richardean Sale M.D.   On: 06/02/2019 11:18    Procedures Procedures (including critical care time)  Medications Ordered in UC Medications - No data to display  Initial  Impression / Assessment and Plan / UC Course  I have reviewed the triage vital signs and the nursing notes.  Pertinent labs & imaging results  that were available during my care of the patient were reviewed by me and considered in my medical decision making (see chart for details).    Recommend follow-up with ENT if numbness not resolved in one week.   Final Clinical Impressions(s) / UC Diagnoses   Final diagnoses:  Contusion of chin, initial encounter  Paresthesia of skin     Discharge Instructions     Apply ice pack for 20 to 30 minutes, 3 to 4 times daily  Continue until pain and swelling decrease. Continue Ibuprofen 200mg , 4 tabs every 8 hours with food.      ED Prescriptions    None         Lattie HawBeese, Maximos Zayas A, MD 06/02/19 1621

## 2019-06-02 NOTE — Discharge Instructions (Addendum)
Apply ice pack for 20 to 30 minutes, 3 to 4 times daily  Continue until pain and swelling decrease. Continue Ibuprofen 200mg , 4 tabs every 8 hours with food.

## 2019-06-02 NOTE — ED Triage Notes (Signed)
Patient hit a tree with a metal baseball bat and it bounced back and hit her in the chin causing severe pain.

## 2019-08-01 MED FILL — FLUCONAZOLE 150 MG TABLET: 150 | 3 days supply | Qty: 2 | Fill #0

## 2019-09-17 MED FILL — ONDANSETRON ODT 8 MG TABLET: 8 | 10 days supply | Qty: 30 | Fill #0

## 2019-10-31 LAB — CBC AND DIFFERENTIAL
HCT: 37 (ref 36–46)
Hemoglobin: 11.8 — AB (ref 12.0–16.0)
Platelets: 264 (ref 150–399)
WBC: 8.8

## 2019-10-31 LAB — BASIC METABOLIC PANEL
BUN: 5 (ref 4–21)
Chloride: 103 (ref 99–108)
Creatinine: 0.5 (ref 0.5–1.1)
Glucose: 74
Potassium: 3.8 (ref 3.4–5.3)
Sodium: 140 (ref 137–147)

## 2019-10-31 LAB — HEPATIC FUNCTION PANEL
ALT: 11 (ref 7–35)
AST: 15 (ref 13–35)
Alkaline Phosphatase: 65 (ref 25–125)
Bilirubin, Direct: 0.1 (ref 0.01–0.4)
Bilirubin, Total: 0.3

## 2019-10-31 LAB — CBC: RBC: 4.05 (ref 3.87–5.11)

## 2019-10-31 LAB — TSH: TSH: 2.93 (ref 0.41–5.90)

## 2019-11-16 IMAGING — DX MANDIBLE - 4+ VIEW
4 series · 4 of 4 positions shown · non-contrast
Comparison: Cervical spine radiographs 03/21/2017.

CLINICAL DATA: Left jaw pain and swelling following injury
yesterday.

EXAM:
MANDIBLE - 4+ VIEW

[mandible pa]
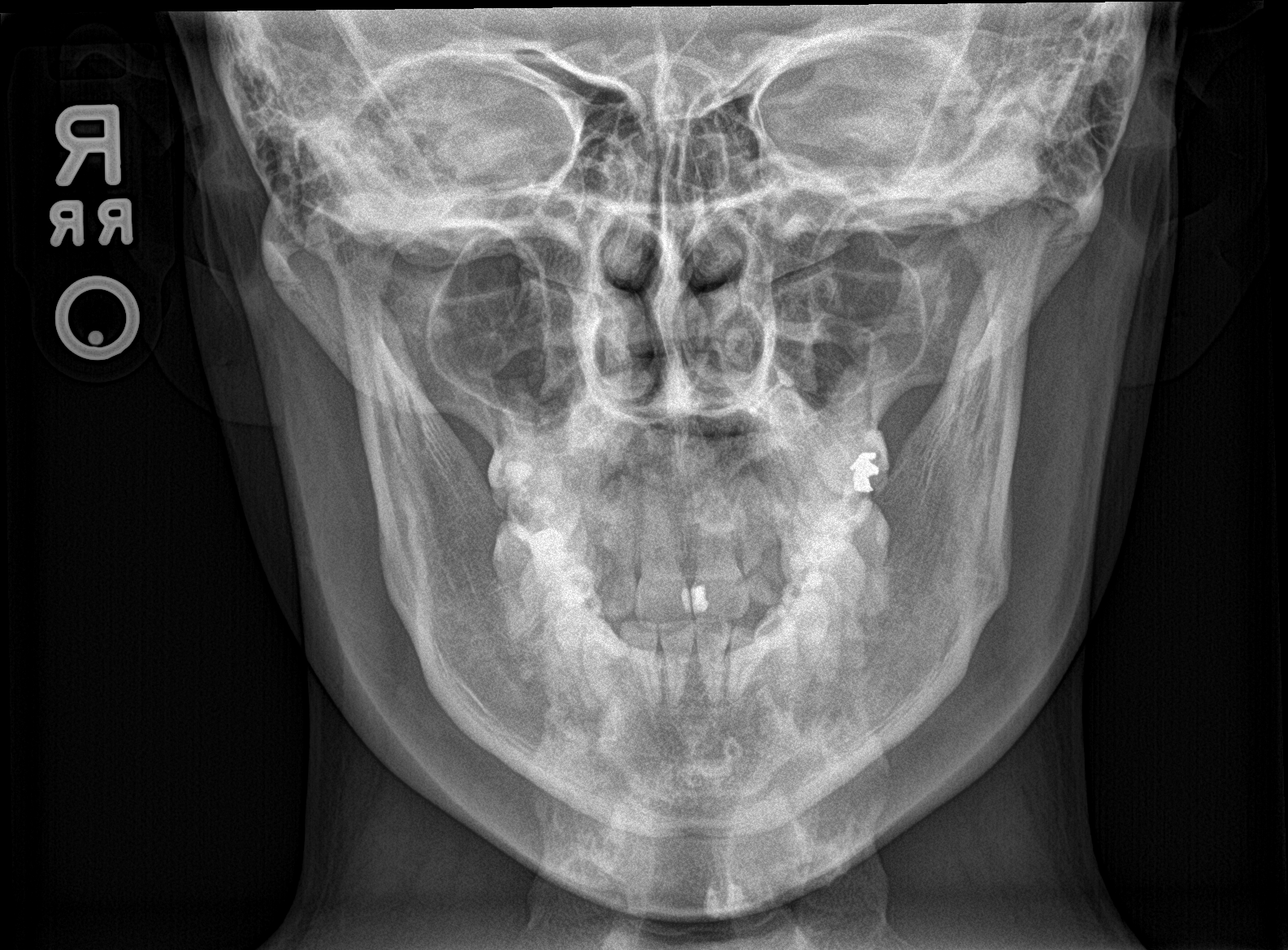

[mandible townes]
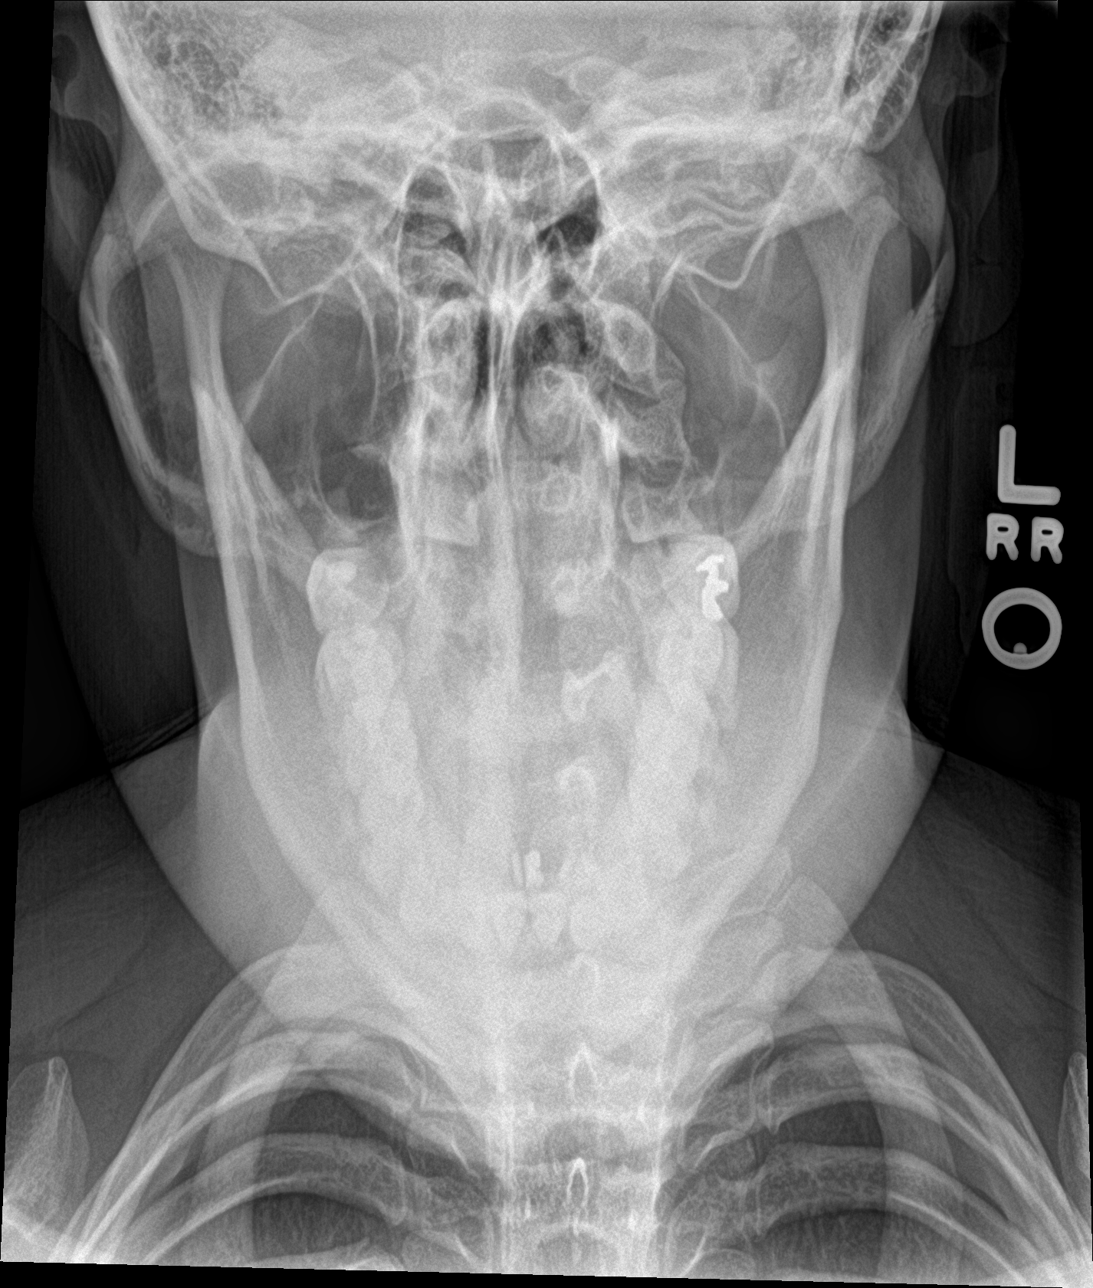

[skull lat (1 of 2)]
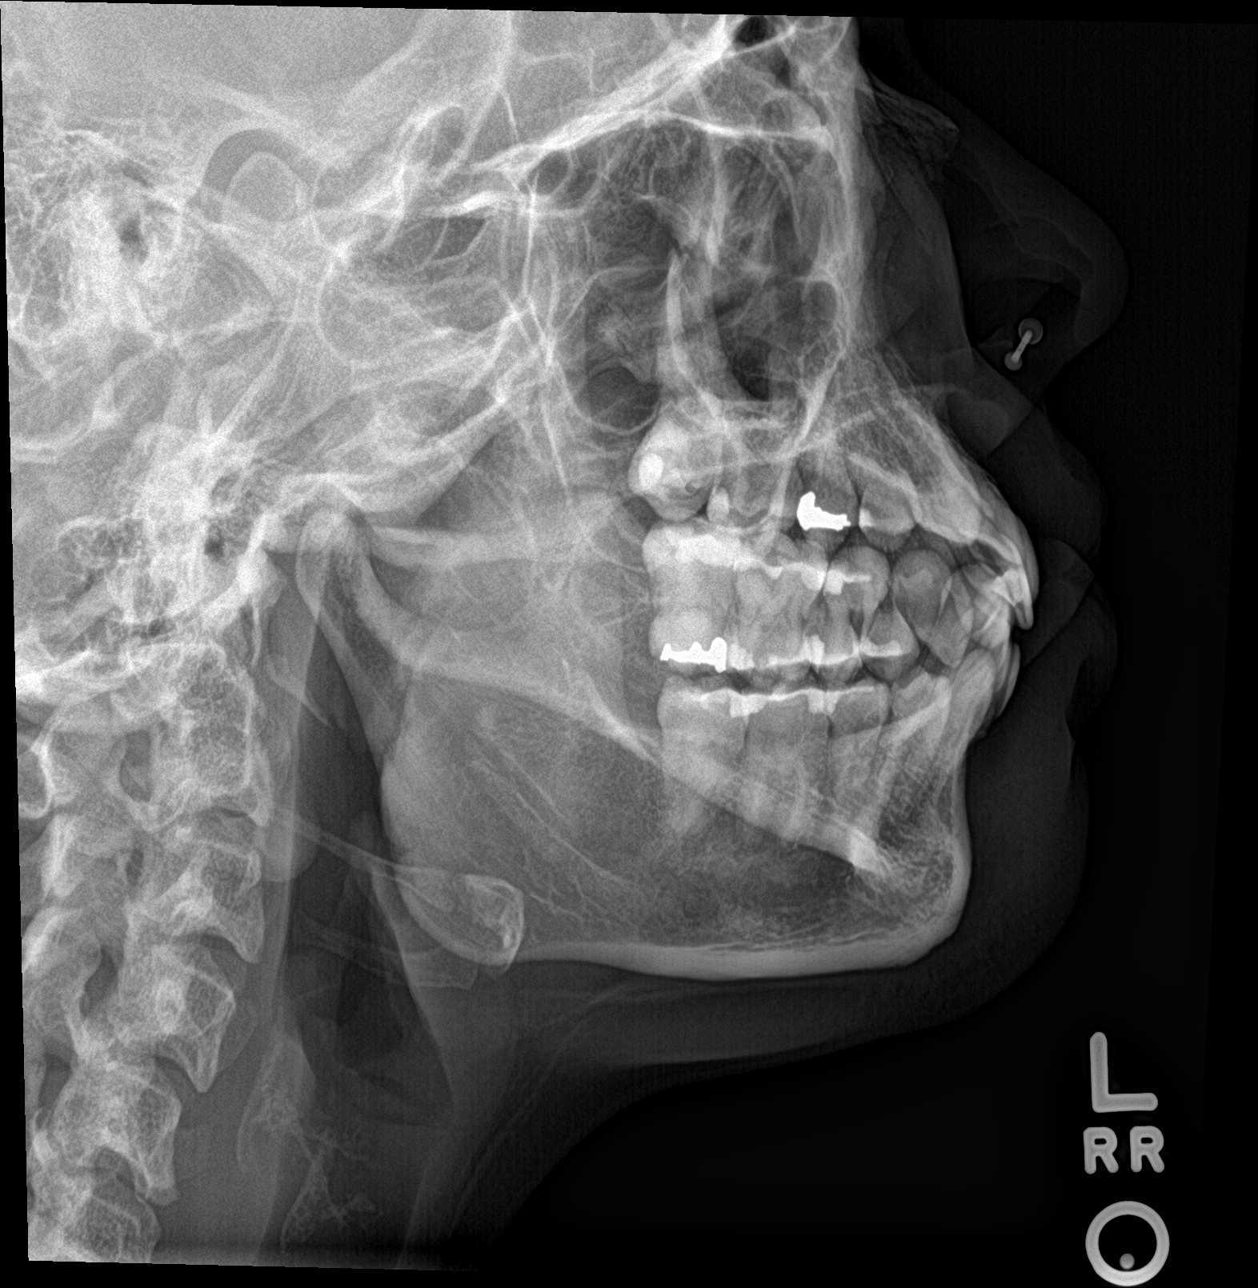

[skull lat (2 of 2)]
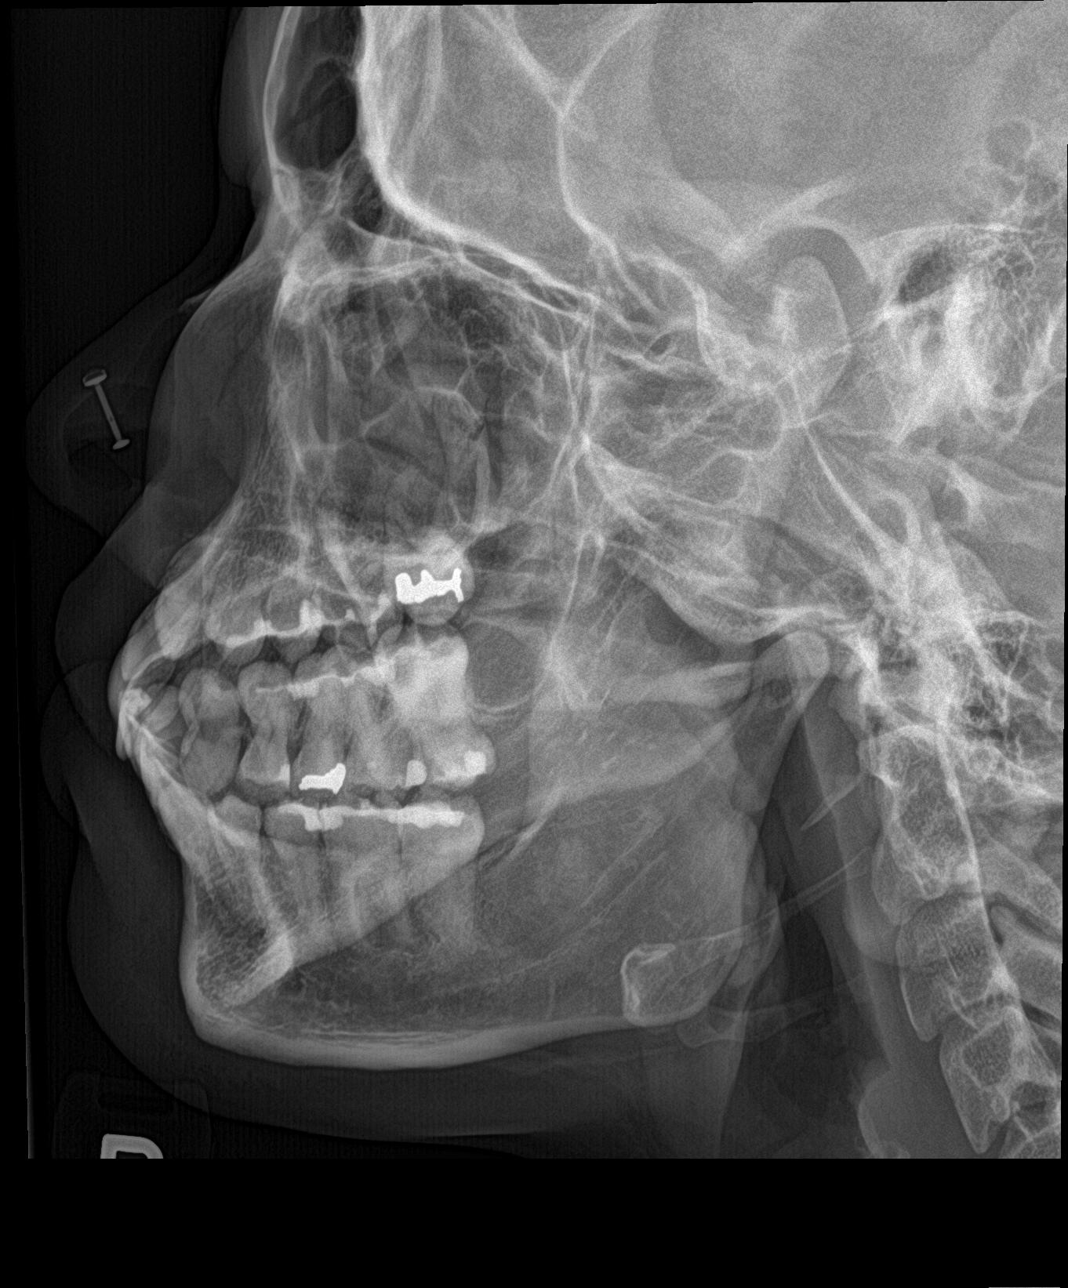

[4 of 4 positions shown; findings below may reference images not displayed]

FINDINGS: There is no evidence of acute fracture or dislocation. The
temporomandibular joints appear intact. Patient has a nasal ring. No
other foreign bodies or focal soft tissue swelling identified.
IMPRESSION: No acute osseous findings.

## 2019-11-19 ENCOUNTER — Encounter: Payer: Self-pay | Admitting: Family Medicine
